# Patient Record
Sex: Female | Born: 1970 | Race: Black or African American | Hispanic: No | Marital: Married | Smoking: Former smoker
Health system: Southern US, Community
[De-identification: ages and names within clinical notes are randomized; demographics above are authoritative.]

## PROBLEM LIST (undated history)

## (undated) DIAGNOSIS — I209 Angina pectoris, unspecified: Secondary | ICD-10-CM

## (undated) DIAGNOSIS — D649 Anemia, unspecified: Secondary | ICD-10-CM

## (undated) DIAGNOSIS — F32A Depression, unspecified: Secondary | ICD-10-CM

## (undated) DIAGNOSIS — E039 Hypothyroidism, unspecified: Secondary | ICD-10-CM

## (undated) DIAGNOSIS — Z794 Long term (current) use of insulin: Secondary | ICD-10-CM

## (undated) DIAGNOSIS — F329 Major depressive disorder, single episode, unspecified: Secondary | ICD-10-CM

## (undated) DIAGNOSIS — E119 Type 2 diabetes mellitus without complications: Secondary | ICD-10-CM

## (undated) DIAGNOSIS — R51 Headache: Secondary | ICD-10-CM

## (undated) DIAGNOSIS — E785 Hyperlipidemia, unspecified: Secondary | ICD-10-CM

## (undated) HISTORY — DX: Type 2 diabetes mellitus without complications: E11.9

## (undated) HISTORY — DX: Long term (current) use of insulin: Z79.4

## (undated) HISTORY — DX: Hyperlipidemia, unspecified: E78.5

## (undated) HISTORY — DX: Major depressive disorder, single episode, unspecified: F32.9

## (undated) HISTORY — DX: Depression, unspecified: F32.A

## (undated) HISTORY — DX: Morbid (severe) obesity due to excess calories: E66.01

---

## 1998-01-15 ENCOUNTER — Other Ambulatory Visit: Admission: RE | Admit: 1998-01-15 | Discharge: 1998-01-15 | Payer: Self-pay | Admitting: Obstetrics and Gynecology

## 1999-05-09 ENCOUNTER — Other Ambulatory Visit: Admission: RE | Admit: 1999-05-09 | Discharge: 1999-05-09 | Payer: Self-pay | Admitting: Obstetrics and Gynecology

## 1999-10-10 ENCOUNTER — Other Ambulatory Visit: Admission: RE | Admit: 1999-10-10 | Discharge: 1999-10-10 | Payer: Self-pay | Admitting: *Deleted

## 2000-09-29 ENCOUNTER — Encounter: Admission: RE | Admit: 2000-09-29 | Discharge: 2000-12-28 | Payer: Self-pay | Admitting: Family Medicine

## 2000-10-07 ENCOUNTER — Other Ambulatory Visit: Admission: RE | Admit: 2000-10-07 | Discharge: 2000-10-07 | Payer: Self-pay | Admitting: Obstetrics and Gynecology

## 2002-06-14 ENCOUNTER — Other Ambulatory Visit: Admission: RE | Admit: 2002-06-14 | Discharge: 2002-06-14 | Payer: Self-pay | Admitting: Obstetrics and Gynecology

## 2002-06-29 ENCOUNTER — Emergency Department (HOSPITAL_COMMUNITY): Admission: EM | Admit: 2002-06-29 | Discharge: 2002-06-29 | Payer: Self-pay | Admitting: Emergency Medicine

## 2003-06-22 ENCOUNTER — Other Ambulatory Visit: Admission: RE | Admit: 2003-06-22 | Discharge: 2003-06-22 | Payer: Self-pay | Admitting: Obstetrics and Gynecology

## 2003-09-08 ENCOUNTER — Encounter: Admission: RE | Admit: 2003-09-08 | Discharge: 2003-09-08 | Payer: Self-pay | Admitting: Obstetrics and Gynecology

## 2004-10-24 ENCOUNTER — Other Ambulatory Visit: Admission: RE | Admit: 2004-10-24 | Discharge: 2004-10-24 | Payer: Self-pay | Admitting: Obstetrics and Gynecology

## 2005-03-28 ENCOUNTER — Emergency Department (HOSPITAL_COMMUNITY): Admission: EM | Admit: 2005-03-28 | Discharge: 2005-03-28 | Payer: Self-pay | Admitting: Family Medicine

## 2005-03-28 ENCOUNTER — Ambulatory Visit (HOSPITAL_COMMUNITY): Admission: RE | Admit: 2005-03-28 | Discharge: 2005-03-28 | Payer: Self-pay | Admitting: Family Medicine

## 2005-10-24 ENCOUNTER — Other Ambulatory Visit: Admission: RE | Admit: 2005-10-24 | Discharge: 2005-10-24 | Payer: Self-pay | Admitting: Obstetrics and Gynecology

## 2006-05-07 ENCOUNTER — Emergency Department (HOSPITAL_COMMUNITY): Admission: EM | Admit: 2006-05-07 | Discharge: 2006-05-08 | Payer: Self-pay | Admitting: Emergency Medicine

## 2006-09-29 ENCOUNTER — Encounter: Admission: RE | Admit: 2006-09-29 | Discharge: 2006-09-29 | Payer: Self-pay | Admitting: Gastroenterology

## 2006-11-12 ENCOUNTER — Ambulatory Visit (HOSPITAL_COMMUNITY): Admission: RE | Admit: 2006-11-12 | Discharge: 2006-11-12 | Payer: Self-pay | Admitting: Obstetrics and Gynecology

## 2007-03-11 ENCOUNTER — Inpatient Hospital Stay (HOSPITAL_COMMUNITY): Admission: AD | Admit: 2007-03-11 | Discharge: 2007-03-12 | Payer: Self-pay | Admitting: Obstetrics and Gynecology

## 2007-03-23 ENCOUNTER — Inpatient Hospital Stay (HOSPITAL_COMMUNITY): Admission: AD | Admit: 2007-03-23 | Discharge: 2007-03-23 | Payer: Self-pay | Admitting: Obstetrics and Gynecology

## 2007-04-15 ENCOUNTER — Inpatient Hospital Stay (HOSPITAL_COMMUNITY): Admission: AD | Admit: 2007-04-15 | Discharge: 2007-04-19 | Payer: Self-pay | Admitting: Pediatrics

## 2008-04-20 ENCOUNTER — Encounter: Admission: RE | Admit: 2008-04-20 | Discharge: 2008-04-20 | Payer: Self-pay | Admitting: Family Medicine

## 2008-04-20 ENCOUNTER — Encounter: Payer: Self-pay | Admitting: Internal Medicine

## 2008-07-31 DIAGNOSIS — E785 Hyperlipidemia, unspecified: Secondary | ICD-10-CM | POA: Insufficient documentation

## 2008-08-01 ENCOUNTER — Ambulatory Visit: Payer: Self-pay | Admitting: Internal Medicine

## 2008-08-01 DIAGNOSIS — R0602 Shortness of breath: Secondary | ICD-10-CM | POA: Insufficient documentation

## 2008-08-01 DIAGNOSIS — R059 Cough, unspecified: Secondary | ICD-10-CM | POA: Insufficient documentation

## 2008-08-01 DIAGNOSIS — R05 Cough: Secondary | ICD-10-CM

## 2008-08-16 ENCOUNTER — Ambulatory Visit (HOSPITAL_COMMUNITY): Admission: RE | Admit: 2008-08-16 | Discharge: 2008-08-16 | Payer: Self-pay | Admitting: Internal Medicine

## 2009-10-05 ENCOUNTER — Ambulatory Visit (HOSPITAL_COMMUNITY): Admission: RE | Admit: 2009-10-05 | Discharge: 2009-10-05 | Payer: Self-pay | Admitting: General Surgery

## 2009-10-12 ENCOUNTER — Ambulatory Visit (HOSPITAL_COMMUNITY): Admission: RE | Admit: 2009-10-12 | Discharge: 2009-10-12 | Payer: Self-pay | Admitting: General Surgery

## 2009-11-10 ENCOUNTER — Encounter: Admission: RE | Admit: 2009-11-10 | Discharge: 2009-12-21 | Payer: Self-pay | Admitting: General Surgery

## 2010-08-06 NOTE — Discharge Summary (Signed)
NAMEJarold Song NO.:  [id number]   MEDICAL RECORD NO.:  [id number]          PATIENT TYPE:  INP   LOCATION:  9128                          FACILITY:  [facility]   PHYSICIAN:  Hal Morales, M.D.DATE OF BIRTH:  05-12-1970   DATE OF ADMISSION:  04/15/2007  DATE OF DISCHARGE:  04/19/2007                               DISCHARGE SUMMARY   ADMISSION DIAGNOSES:  1. Intrauterine pregnancy at 38-5/7 weeks.  2. Premature rupture of membranes with meconium stained fluid.  3. Type 2 diabetes mellitus.   DISCHARGE DIAGNOSES:  1. Intrauterine pregnancy at term.  2. Failure to progress.  3. Status post primary low transverse cesarean section.  4. Pumping and breast-feeding.  5. Postpartum anemia without hemodynamic instability.  6. History of type 2 diabetes.   PROCEDURES:  1. Primary low transverse cesarean section.  2. Epidural anesthesia.   HOSPITAL COURSE:  Ms. Elana Alm is a 40 year old single black female,  primigravida at 38-5/7 weeks who presented with leakage of yellow fluid  at around 10 p.m. on April 14, 2007.  She was admitted at  approximately midnight on April 15, 2007.  Her pregnancy was followed  by [facility] OB/GYN MD  Service and history had been remarkable  for  1. Type 2 diabetes.  2. Questionable last menstrual period.  3. Abnormal quad screen with normal amniocentesis.  4. Group Beta Strep negative.   Patient had been on an insulin schedule of Humulin 14 units every  morning and 12 units every p.m.  On admission to birthing suites,  patient's CBG was 97.  Fetal heart rate was in the 140s with some 10 x  10 accels, however, no contractions.  Cervix was closed 70%, vertex and  -3, moderate yellow meconium-stained fluid was noted on perineum and she  was admitting to birthing suits with plan for pitocin per low dose for  induction of labor and CBGs every two hours.  At approximately 4:30 a.m.  on April 15, 2007, irregular  contractions were noted.  Blood sugars  were within normal limits.  Patient had a reactive NST and pitocin was  infusing per protocol.  At approximately 9:00 a.m. on April 15, 2007,  the patient had a negative OCT.  Blood pressures were stable, 130s-  140s/70s to mid 80s.  Blood sugars were normal and pitocin continued to  infuse.  Patient did receive pastoral care per request and patient was  taking a Bible secondary to pastoral care.  At 4:52 p.m., Dr. Stefano Gaul  noted patient's cervix to be 1 cm, 75% effaced, -3 and vertex.  IUPC was  placed but not functioning.  Pitocin was infusing and patient was  stable.  OCT was negative.  Approximately 10:50 p.m. on April 15, 2007, cervix was 2 cm, 80% effaced, -2 to -3, more anterior, decels had  been noted but negative OCT at time of assessment.  Patient was  comfortable status post an epidural.  IUPC was replaced and pitocin  continued to infuse.  Just before midnight on April 15, 2007, return  of decels, amnio infusion was begun, decels  resolved and resulted with a  negative OCT and pitocin was to continue.  At 1:30 a.m. on April 16, 2007, cervix was unchanged by nurse and observation was continued.  At  5:15 a.m. on April 16, 2007, cervix was 3 cm, 75% effaced, -1, vertex  and swelling of cervix was noted per Dr. Stefano Gaul.  Blood sugar was 128  at that time. Vital signs were stable otherwise.  OCT was negative and  plan was made to continue to observe her labor progression.  At 6:34  a.m., the cervix remained 3 cm, 75% effaced, -1 to 0 station and vertex.  Patient complained of pressure.  [place] units were 200.  Blood sugar  133.  Options were reviewed with the patient per Dr. Stefano Gaul.  Patient  desired to continue to try for a vaginal delivery.  At 9 a.m., pitocin  was at 38 milliunits per minute.  Patient was contracting every two  minutes.  MVUs were 170-190, which had been adequate since 5:00 a.m.  Fetal heart rate was  reassuring.  Vaginal exam was 3, 80, vertex and 0  with moderate amount of molding per Dr. Estanislado Pandy and an anterior edematous  lip.  There was no change in four hours.  Options were reviewed with the  patient, i.e., increasing pitocin to 40 milliunits per minute and  reevaluate in two hours or proceed with cesarean section.  Patient  desired to proceed with cesarean section secondary to exhaustion and  failure to progress and risks and benefits were reviewed.  Questions  were answered.  Patient was prepped for OR and findings following  primary low transverse cesarean section per Dr. Estanislado Pandy under epidural  anesthesia were a viable female by the name of Selah.  Apgars were 5 at  one minute and 8 at five minutes.  Weight was 6 pounds 4 ounces and  newborn was found to be in OP presentation.  Patient tolerated the  procedure well and she was sent to PACU in good condition.   By postoperative day #1, patient was doing well.  She was ambulating,  voiding and eating without difficulty and breast-feeding was going slow.  Hemoglobin was down to 9 from 11.3 on the day of admission.  Vital signs  were stable and afebrile.  Physical examination was within normal  limits.  Incision was clean, dry and intact.  Fundus was firm.  Abdomen  was soft and appropriately tender.  She had normal lochia and  extremities were within normal limits.  Fasting blood sugar was 105.  Two hours after breakfast, this was 143, however, had some orange juice.  Two hours after dinner on April 16, 2007, it was 129; two hours after  lunch that day, it was 136.  Continued to have routine postpartum care  and CBGs were closely monitored.  Patient was to discontinue dressing  after shower.  During postpartum stay, newborn female was admitted to  NICU.  Social work consult was done on April 18, 2007, which was  postoperative day #2.  On that day, patient was doing okay, some stomach  upset after taking Motrin but no emesis.   She was ambulating, voiding  and tolerating p.o. liquids and solids without difficulty with positive  flatulence and negative bowel movement.  Infant in NICU per patient  secondary to an infection but was doing okay.  She had been pumping but  with little milk production.  Patient's vital signs remained stable.  She was afebrile.  Blood pressures  were 97-119/62-81.  Incision had  Steri-Strips intact.  She had bowel sounds x4 quadrants.  It was soft  and appropriately tender.  Fundus was firm, some bloody drainage was  noted at incision.  Extremities with trace edema but negative Homan's.  CBGs fasting on April 18, 2007, was 111; two hours postprandial on  April 17, 2007, at 1600 had been 133 at 2309.  Patient remained  asymptomatic with postpartum anemia and patient was encouraged to pump  every two hours to increase milk supply.   On postoperative day #3, patient was met in hallway on her way up to  NICU and was doing well.  She did desire to be discharged home, however,  newborn female was still in NICU and, per patient, NICU staff proposing  approximately one week stay for antibiotics.  Patient was tolerating a  regular diet.  She was up ad lib.  She was voiding without difficulty.  Pain was well controlled with Motrin and Tylox.  She was undecided  regarding postpartum birth control method.  She declined contraception  at present and planned to reassess contraceptive options at six-week  postpartum visit.  Patient was pumping and she did report a small amount  of milk noted.  She did report newborn female had latched on on her a.m.  trip to the NICU.  Her vital signs remained stable.  Her fasting CBG  this morning was 118.  Yesterday, at 7:15 in the morning, fasting was  111; at 1530 on April 18, 2007, was 171, at 8:00 p.m. on April 18, 2007, it was 118.  Her physical examination was within normal limits.  Steri-Strips remained intact with old drainage.  Extremities with  trace  edema, negative Homan's and small lochia which was rubra.  Patient was  deemed to have received full benefit of hospital stay and was discharged  home in stable condition.   DISCHARGE INSTRUCTIONS:  Per [facility] pamphlet.  Warning signs and symptoms  to report were reviewed per mother/baby RN.   DISCHARGE MEDICATIONS:  1. Motrin 600 mg one tablet p.o. q.6h. p.r.n. pain.  2. Tylox one to two tablets p.o. q.4-6h. p.r.n. moderate to severe      pain.  3. Slow Fe over-the-counter one tablet p.o. daily.  4. Protonix 40 mg one tablet p.o. daily.  5. Continue prenatal vitamins one tablet p.o. daily as well.  6. Stool softener such as docusate sodium one tablet p.o. daily or      b.i.d. p.r.n. for constipation.   FOLLOW UP:  This is to occur in six weeks or p.r.n. when patient will  rediscuss contraceptive options.  Patient will also have a fasting blood  sugar, cholesterol, lipids and hemoglobin A1c at her six-week checkup as  well.      Candice [place], [place]      Hal Morales, M.D.  Electronically Signed    CHS/MEDQ  D:  04/19/2007  T:  04/19/2007  Job:  [id number]

## 2010-08-06 NOTE — Op Note (Signed)
NAME:  Virginia Stewart NO.:  [id number]   MEDICAL RECORD NO.:  [id number]          PATIENT TYPE:  INP   LOCATION:  9128                          FACILITY:  [facility]   PHYSICIAN:  Crist Fat. Rivard, M.D. DATE OF BIRTH:  1971-02-15   DATE OF PROCEDURE:  04/16/2007  DATE OF DISCHARGE:                               OPERATIVE REPORT   PREOPERATIVE DIAGNOSES:  1. Intrauterine pregnancy at 38 weeks 6 days.  2. Type 2 diabetes.  3. Failure to progress.   POSTOPERATIVE DIAGNOSES:  1. Intrauterine pregnancy at 38 weeks 6 days.  2. Type 2 diabetes.  3. Failure to progress.   ANESTHESIA:  Epidural, Quillian Quince, M.D.   PROCEDURE:  Primary low transverse cesarean section.   SURGEON:  Crist Fat. Rivard, M.D.   ASSISTANT:  Larna Daughters, CNM.   ESTIMATED BLOOD LOSS:  800 mL.   PROCEDURE:  After being informed of the planned procedure with possible  complications including bleeding, infection, injury to bowels, bladder  or ureter, informed consent is obtained.  The patient is taken to OR #1  and preexisting epidural is intensified.  The patient is placed in the  dorsal decubitus position, pelvis tilted to the left.  She is prepped  and draped in a sterile fashion and a Foley catheter is already in her  bladder.  After assessing adequate level of anesthesia, we infiltrate  the suprapubic area with 20 mL of Marcaine 0.25% and we perform a  Pfannenstiel incision, which is brought down sharply to the fascia.  The  fascia is incised in a low transverse fashion.  Linea alba is dissected.  Peritoneum is entered in a midline fashion.  An Alexis retractor is  placed easily and a bladder blade is inserted.  Visceral peritoneum is  entered in a low transverse fashion, allowing [place] to safely retract  bladder by developing a bladder flap.  Myometrium is then entered in a  low transverse fashion, first with knife, then extended bluntly.  Amniotic fluid is abundant and  yellow-tinged.  We assist the birth of a  female infant in direct OP presentation.  Mouth and nose were suctioned.  Baby is delivered.  Cord is clamped with two Kelly clamps and sectioned  and the baby is given to Dr. Katrinka Blazing, neonatologist present in the room.  The placenta is allowed to deliver spontaneously.  It is complete, the  cord has three vessels, and it is sent for cord blood donation, then to  labor and delivery.  Ancef 2 g IV is given to the patient and a  perfusion of Pitocin is started.  Uterine revision is negative.   We proceed with closure of the myometrium in two layers, first with a  running locked suture of 0 Vicryl, then with a Lembert suture of 0  Vicryl imbricating the first one.  A tear in the lower uterine segment  in the right angle was repaired with two figure-of-eight stitches of 0  Vicryl and was incorporated in the second layer of the Lembert suture.  Hemostasis is checked and adequate.  Both paracolic  gutters are cleaned.  Both tubes and ovaries are assessed and normal except for some fine  adhesions between the right tube and the ovary, which are removed  sharply and with Bovie, as well as a small right paratubal cyst which is  evacuated with Bovie.  We then irrigate profusely the pelvis with warm  saline and note a satisfactory hemostasis.  Retractors are removed.  Under-fascia hemostasis was completed with cautery and the fascia is  closed with two running sutures of #1 Vicryl meeting midline.  The wound  is irrigated with warm saline and the incision is closed with a  subcuticular suture of 3-0 Monocryl and Steri-Strips.   Instrument and sponge count is complete x2.  Estimated blood loss is 800  mL.  The procedure is very well-tolerated by the patient, who is taken  to recovery room in a well and stable condition.   A little girl named Selah received an Apgar of 5 at one minute and 8 at  five minutes and weighs 6 pounds 4 ounces.   SPECIMEN:  Placenta  sent to cord blood donation, then to labor and  delivery.      Crist Fat Rivard, M.D.  Electronically Signed     SAR/MEDQ  D:  04/16/2007  T:  04/16/2007  Job:  [id number]
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
## 2010-08-06 NOTE — Op Note (Signed)
NAME:  Virginia Stewart NO.:  [id number]   MEDICAL RECORD NO.:  [id number]          PATIENT TYPE:  OUT   LOCATION:  ULT                           [facility]   PHYSICIAN:  Hal Morales, M.D.DATE OF BIRTH:  11-14-1970   DATE OF PROCEDURE:  11/12/2006  DATE OF DISCHARGE:                               OPERATIVE REPORT   PREOPERATIVE DIAGNOSIS:  Intrauterine pregnancy at 67 weeks' gestation,  increased risk of Down syndrome on maternal serum screening.   POSTOPERATIVE DIAGNOSES:  Intrauterine pregnancy at 19 weeks' gestation,  increased risk of Down syndrome on maternal serum screening.   OPERATION:  Genetic amniocentesis.   ANESTHESIA:  Local.   ESTIMATED BLOOD LOSS:  Was less than 5 mL.   COMPLICATIONS:  None.   FINDINGS:  The placenta was located anteriorly and right lateral.  The  amniotic fluid volume was essentially within normal limits.   PROCEDURE:  The patient was placed in the supine position in the  ultrasound suite.  A preliminary ultrasound evaluation of the amniotic  fluid pocket was performed.  An area that corresponded to the midline  approximately 5 cm below the umbilicus was marked.  That area was  cleansed with multiple layers of Betadine.  The area was infiltrated  with 1% Xylocaine.  A 22 gauge ultraview needle was used to access the  amniotic fluid sac on a single pass.  5 mL of clear amniotic fluid were  drawn into the first syringe and then 10 mL of amniotic fluid drawn into  the second syringe.  Documentation of needle placement was undertaken  and the needle removed.  The post amniocentesis heart rate was 141 beats  per minute.  The patient's blood type is O+.  The patient tolerated the  procedure well and was given written instructions for post amniocentesis  care.      Hal Morales, M.D.  Electronically Signed     VPH/MEDQ  D:  11/12/2006  T:  11/12/2006  Job:  [id number]
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
## 2010-08-06 NOTE — Discharge Summary (Signed)
NAMEJarold Song NO.:  [id number]   MEDICAL RECORD NO.:  [id number]          PATIENT TYPE:  INP   LOCATION:  9149                          FACILITY:  [facility]   PHYSICIAN:  Osborn Coho, M.D.   DATE OF BIRTH:  1970/05/25   DATE OF ADMISSION:  03/11/2007  DATE OF DISCHARGE:  03/12/2007                               DISCHARGE SUMMARY   DISCHARGING PHYSICIAN:  Dr. Su Hilt.   ADMISSION DIAGNOSES:  1. Intrauterine pregnancy at 33-6/7 weeks.  2. Type 2 diabetes requiring insulin.  3. Decreased AFI with BPP in the office.  4. Abnormal quad screen during the pregnancy was normal amnio.   DISCHARGE DIAGNOSES:  1. Same with the addition of reassuring BPP and normal AFI on      discharge.   HOSPITAL PROCEDURES:  1. IV fluids.  2. Ultrasound.   HOSPITAL COURSE:  The patient was admitted after having decreased AFI on  office ultrasound.  She was placed on IV fluids for hydration and had a  repeat ultrasound today which showed a BPP score of 8/8 with an AFI of  12.69, which represents 39 percentile.  Placenta was anterior, and the  fetus was in a vertex position.  She was deemed to receive full benefit  of her hospital stay and was discharged home by Dr. Su Hilt.  Throughout  hospitalization, her fetal heart tones were reassuring with no  contractions.  A fasting blood sugar this morning was 81.  Other blood  sugars were in the low 100s.   DISCHARGE MEDICATIONS:  1. Prenatal vitamins one p.o. daily.  2. Insulin NPH 15 units at bedtime and NPH of 12 units in a.m. with      sliding scale.   DISCHARGE LABS:  None.   DISCHARGE INSTRUCTIONS:  Include normal care and monitoring of fetal  kick counts.   DISCHARGE FOLLOWUP:  Will occur on March 17, 2007, for a visit in the  office with an ultrasound.   CONDITION ON DISCHARGE:  Good.      Marie L. Williams, C.N.M.      Osborn Coho, M.D.  Electronically Signed    MLW/MEDQ  D:  03/12/2007  T:   03/14/2007  Job:  [id number]

## 2010-10-23 ENCOUNTER — Emergency Department (HOSPITAL_COMMUNITY)
Admission: EM | Admit: 2010-10-23 | Discharge: 2010-10-24 | Disposition: A | Discharge status: Home / Self Care | Payer: BC Managed Care – PPO | Attending: Emergency Medicine | Admitting: Emergency Medicine

## 2010-10-23 DIAGNOSIS — Z794 Long term (current) use of insulin: Secondary | ICD-10-CM | POA: Insufficient documentation

## 2010-10-23 DIAGNOSIS — D649 Anemia, unspecified: Secondary | ICD-10-CM | POA: Insufficient documentation

## 2010-10-23 DIAGNOSIS — R079 Chest pain, unspecified: Secondary | ICD-10-CM | POA: Insufficient documentation

## 2010-10-23 DIAGNOSIS — E871 Hypo-osmolality and hyponatremia: Secondary | ICD-10-CM | POA: Insufficient documentation

## 2010-10-23 DIAGNOSIS — I209 Angina pectoris, unspecified: Secondary | ICD-10-CM

## 2010-10-23 DIAGNOSIS — E876 Hypokalemia: Secondary | ICD-10-CM | POA: Insufficient documentation

## 2010-10-23 DIAGNOSIS — E119 Type 2 diabetes mellitus without complications: Secondary | ICD-10-CM | POA: Insufficient documentation

## 2010-10-23 HISTORY — DX: Angina pectoris, unspecified: I20.9

## 2010-10-23 LAB — COMPREHENSIVE METABOLIC PANEL
Alkaline Phosphatase: 99 U/L (ref 39–117)
BUN: 11 mg/dL (ref 6–23)
Calcium: 9.8 mg/dL (ref 8.4–10.5)
GFR calc Af Amer: >60 mL/min (ref >60)
GFR calc non Af Amer: >60 mL/min (ref >60)
Potassium: 3.9 mEq/L (ref 3.5–5.1)
Sodium: 132 mEq/L — ABNORMAL LOW (ref 135–145)

## 2010-10-23 LAB — CBC
HCT: 34.9 % — ABNORMAL LOW (ref 36.0–46.0)
Hemoglobin: 11 g/dL — ABNORMAL LOW (ref 12.0–15.0)

## 2010-10-23 LAB — DIFFERENTIAL
Basophils Absolute: 0 10*3/uL (ref 0.0–0.1)
Basophils Relative: 0 % (ref 0–1)
Eosinophils Relative: 1 % (ref 0–5)
Lymphs Abs: 3.2 10*3/uL (ref 0.7–4.0)
Monocytes Absolute: 0.5 10*3/uL (ref 0.1–1.0)

## 2010-10-23 LAB — CK TOTAL AND CKMB (NOT AT ARMC): Relative Index: 0.7 (ref 0.0–2.5)

## 2010-10-23 LAB — TROPONIN I: Troponin I: <0.3 ng/mL (ref <0.30)

## 2010-10-24 ENCOUNTER — Emergency Department (HOSPITAL_COMMUNITY): Payer: BC Managed Care – PPO

## 2010-10-24 LAB — TROPONIN I: Troponin I: <0.3 ng/mL (ref <0.30)

## 2010-10-24 LAB — CK TOTAL AND CKMB (NOT AT ARMC): Relative Index: 0.7 (ref 0.0–2.5)

## 2010-11-20 ENCOUNTER — Other Ambulatory Visit: Payer: Self-pay | Admitting: Family Medicine

## 2010-11-22 ENCOUNTER — Ambulatory Visit
Admission: RE | Admit: 2010-11-22 | Discharge: 2010-11-22 | Disposition: A | Discharge status: Home / Self Care | Payer: BC Managed Care – PPO | Source: Ambulatory Visit | Attending: Family Medicine | Admitting: Family Medicine

## 2010-11-22 MED ORDER — GADOBENATE DIMEGLUMINE 529 MG/ML IV SOLN
20.0000 mL | Freq: Once | INTRAVENOUS | Status: AC | PRN
  Administered 2010-11-22: 20 mL via INTRAVENOUS

## 2010-12-12 LAB — CBC
Hemoglobin: 9 — ABNORMAL LOW
MCV: 84.8
Platelets: 226
RBC: 3.06 — ABNORMAL LOW
RDW: 14
RDW: 14.4
WBC: 9.6

## 2010-12-12 LAB — RPR: RPR Ser Ql: NONREACTIVE

## 2011-01-03 LAB — ACETYLCHOLINESTERASE

## 2011-01-03 LAB — CHROMOSOME ANALYSIS, AMNIOTIC FLUID (PERF AT WFU)

## 2011-03-13 ENCOUNTER — Ambulatory Visit (INDEPENDENT_AMBULATORY_CARE_PROVIDER_SITE_OTHER): Payer: Self-pay | Admitting: General Surgery

## 2011-06-11 ENCOUNTER — Encounter (INDEPENDENT_AMBULATORY_CARE_PROVIDER_SITE_OTHER): Payer: Self-pay | Admitting: General Surgery

## 2011-06-12 ENCOUNTER — Encounter (INDEPENDENT_AMBULATORY_CARE_PROVIDER_SITE_OTHER): Payer: Self-pay | Admitting: General Surgery

## 2011-06-12 ENCOUNTER — Ambulatory Visit (INDEPENDENT_AMBULATORY_CARE_PROVIDER_SITE_OTHER): Payer: BC Managed Care – PPO | Admitting: General Surgery

## 2011-06-12 DIAGNOSIS — Z794 Long term (current) use of insulin: Secondary | ICD-10-CM

## 2011-06-12 DIAGNOSIS — E119 Type 2 diabetes mellitus without complications: Secondary | ICD-10-CM

## 2011-06-12 NOTE — Progress Notes (Addendum)
Patient ID: Virginia Stewart, female   DOB: [birth year], 41 y.o.   MRN: [id number]  Chief Complaint  Patient presents with  . Weight Loss Surgery    HPI Virginia Stewart is a 41 y.o. female.   HPI 41 year old morbidly obese African American female comes in to rediscuss weight loss surgery. I initially saw her in the summer of 2011. She was actually approved for laparoscopic adjustable gastric band placement surgery that fall. However she had an outstanding debt and lost her job and therefore she postponed surgery.  Since that time, she states that her diabetes has worsened. She is now on insulin. She is taking anywhere from 50-80 units at night. She denies any new surgeries since her last visit.  Despite numerous attempts for sustained weight loss she has been unsuccessful. She has tried the [employer], [employer], [employer], Slim fast, phentermine, all without any long-term success. One of her main goals for weight loss surgery is improvement in her diabetes. She is still interested in lap band surgery over gastric bypass surgery. 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 Past Medical History  Diagnosis Date  . Insulin dependent type 2 diabetes mellitus   . Asthma   . Hyperlipidemia   . Depression     Past Surgical History  Procedure Date  . Cesarean section 04/16/2007    Family History  Problem Relation Age of Onset  . Diabetes Father   . Lung cancer Father   . Diabetes Mother   . Hypertension Mother     Social History History  Substance Use Topics  . Smoking status: Former Smoker    Quit date: 06/11/2004  . Smokeless tobacco: Not on file  . Alcohol Use: 1.0 - 1.5 oz/week    2-3 drink(s) per week    No Known Allergies  Current Outpatient Prescriptions  Medication Sig Dispense Refill  . citalopram (CELEXA) 20 MG tablet Take 20 mg by mouth daily.      . insulin glargine (LANTUS) 100 UNIT/ML injection Inject 60 Units into the skin  at bedtime.      . sitaGLIPtin (JANUVIA) 100 MG tablet Take 100 mg by mouth daily.        Review of Systems Review of Systems  Constitutional: Negative for fever, activity change, appetite change and unexpected weight change.  HENT: Positive for sneezing. Negative for hearing loss, nosebleeds, neck pain and neck stiffness.   Eyes: Negative for photophobia, redness and visual disturbance.  Respiratory: Negative for apnea, chest tightness and shortness of breath.        Uses inhaler prn  Cardiovascular: Negative for chest pain, palpitations and leg swelling.       Denies CP, SOB, DOE, orthopnea, pnd  Gastrointestinal: Negative for nausea, vomiting, abdominal pain, diarrhea, constipation and abdominal distention.       Denies reflux, indigestion  Genitourinary: Negative for dysuria, hematuria, menstrual problem and pelvic pain.       Normal monthly menses   Musculoskeletal: Negative for arthralgias and gait problem.       B/l knee pain; rt ankle pain  Skin: Negative for pallor and rash.  Neurological: Negative for tremors, seizures, speech difficulty, light-headedness and headaches.  Hematological: Negative for adenopathy. Does not bruise/bleed easily.  Psychiatric/Behavioral: Negative for hallucinations, behavioral problems, sleep disturbance and self-injury.       Takes med for depression; good mood recently.     Blood pressure 136/78, pulse 88, resp. rate 20,  height 5\' 7"  (1.702 m), weight 287 lb 8 oz (130.409 kg).  Physical Exam Physical Exam  Vitals reviewed. Constitutional: She is oriented to person, place, and time. She appears well-developed and well-nourished.       Morbidly obese  HENT:  Head: Normocephalic and atraumatic.  Right Ear: External ear normal.  Left Ear: External ear normal.  Eyes: Conjunctivae are normal. No scleral icterus.  Neck: Normal range of motion. Neck supple. No JVD present. No tracheal deviation present. No thyromegaly present.  Cardiovascular:  Normal rate, regular rhythm, normal heart sounds and intact distal pulses.   Pulmonary/Chest: No respiratory distress. She has no wheezes. She exhibits no tenderness.  Abdominal: Soft. Bowel sounds are normal. She exhibits no distension. There is no tenderness. There is no rebound.    Musculoskeletal: Normal range of motion. She exhibits no edema and no tenderness.  Lymphadenopathy:    She has no cervical adenopathy.  Neurological: She is alert and oriented to person, place, and time. She exhibits normal muscle tone.  Skin: Skin is warm and dry. No rash noted. No erythema.  Psychiatric: She has a normal mood and affect. Her behavior is normal. Judgment and thought content normal.    Data Reviewed My note from 2011 Abd u/s 09/2009 - gallstones Upper gi 09/2009 - normal  Psych note Letter of medical necessity Labs from 10/2009: A1C-7.7; normal cmet/cbc except for bld glucose of 250; lipid panel normal except for LDL 108  Assessment    Morbid obesity Dyslipidemia Asthma Insulin dependent diabetes mellitus Joint pain     Plan    I believe the patient is a good candidate for weight loss surgery. She definitely meets criteria for weight loss surgery.  We discussed laparoscopic adjustable gastric banding. The patient was given Agricultural engineer. We discussed the risk and benefits of surgery including but not limited to bleeding, infection, injury to surrounding structures, blood clot formation such as deep venous thrombosis or pulmonary embolism, need to convert to an open procedure, band slippage, band erosion, failure to loose weight, port complications (leak or flippage), potential need for reoperative surgery, esophageal dilatation, worsening reflux, and vitamin deficiencies. We discussed the typical post operative recovery course. We discussed that their postoperative diet will be modified for several weeks. We specifically talked about the need to be on a liquid diet for one to 2  weeks after surgery. We also discussed the typical postoperative course with a laparoscopic adjustable gastric band and the need for frequent postoperative visits to assess the volume status of the band.  We discussed the typical expected weight loss with a laparoscopic adjustable gastric band. I explained to the patient that they can expect to lose 40-60% of their excess body weight if they are compliant with their postoperative instructions. However I did explain that some patients loose less than 40% and some patients lose more than 60% of their excess body weight.  I explained that the likelihood of improvement in their obesity is good.  I did discuss laparoscopic Roux-en-Y gastric bypass. We did discuss that typically gastric bypass patients have a better response with resolution of their diabetes as compared with adjustment gastric band surgery. We briefly discussed the risk and benefits of laparoscopic Roux-en-Y gastric bypass.  She asked a question regarding future pregnancies with respect to two surgeries. I advised her that with gastric bypass surgery we recommend delaying pregnancy for at least one year. This recommendation also holds true for lap band surgery. With respect to the impact  of the surgery on her pregnancy, I advised her that there would be little difference between the 2 surgeries on her pregnancy. We discussed that her diabetes and obesity would be more of the impact on her pregnancy.  After discussing the pros and cons of laparoscopic Roux-en-Y gastric bypass versus laparoscopic adjustable gastric band placement with respect to her diabetes, the patient has elected to proceed with laparoscopic adjustable gastric band placement.  We will repeat her lab work and refer her to a nutritionist for evaluation. I do not see the need to repeat her upper GI or abdominal ultrasound since there are no new GI complaints or issues.  07/14/11 - pt called office to let us know that after  additional reflection and consideration, she has changed her mind and would to have a LAPAROSCOPIC ROUX EN Y GASTRIC BYPASS since her number 1 goal with weight loss surgery is resolution of her Diabetes and she believes that LRYGB will give her the best chance to accomplish this goal.  [name]. [name], MD, FACS General, Bariatric, & Minimally Invasive Surgery [facility], [place]        [facility] M 06/12/2011, 5:45 PM

## 2011-07-08 LAB — COMPREHENSIVE METABOLIC PANEL
ALT: 15 U/L (ref 0–35)
AST: 19 U/L (ref 0–37)
Albumin: 4.3 g/dL (ref 3.5–5.2)
Alkaline Phosphatase: 100 U/L (ref 39–117)
BUN: 7 mg/dL (ref 6–23)
Calcium: 9.1 mg/dL (ref 8.4–10.5)
Chloride: 100 mEq/L (ref 96–112)
Potassium: 4.1 mEq/L (ref 3.5–5.3)
Sodium: 138 mEq/L (ref 135–145)
Total Protein: 7.9 g/dL (ref 6.0–8.3)

## 2011-07-08 LAB — CBC WITH DIFFERENTIAL/PLATELET
Basophils Absolute: 0 10*3/uL (ref 0.0–0.1)
Basophils Relative: 1 % (ref 0–1)
HCT: 35.9 % — ABNORMAL LOW (ref 36.0–46.0)
Lymphocytes Relative: 37 % (ref 12–46)
MCHC: 30.6 g/dL (ref 30.0–36.0)
Monocytes Absolute: 0.5 10*3/uL (ref 0.1–1.0)
Neutro Abs: 2.7 10*3/uL (ref 1.7–7.7)
Neutrophils Relative %: 51 % (ref 43–77)
RDW: 14.7 % (ref 11.5–15.5)
WBC: 5.2 10*3/uL (ref 4.0–10.5)

## 2011-07-08 LAB — LIPID PANEL
LDL Cholesterol: 113 mg/dL — ABNORMAL HIGH (ref 0–99)
Total CHOL/HDL Ratio: 3.7 Ratio
VLDL: 12 mg/dL (ref 0–40)

## 2011-07-08 LAB — HEMOGLOBIN A1C: Hgb A1c MFr Bld: 8.8 % — ABNORMAL HIGH (ref <5.7)

## 2011-07-08 LAB — T4: T4, Total: 8.2 ug/dL (ref 5.0–12.5)

## 2011-07-08 LAB — TSH: TSH: 0.762 u[IU]/mL (ref 0.350–4.500)

## 2011-07-11 ENCOUNTER — Encounter: Payer: BC Managed Care – PPO | Attending: General Surgery | Admitting: *Deleted

## 2011-07-11 ENCOUNTER — Encounter: Payer: Self-pay | Admitting: *Deleted

## 2011-07-11 VITALS — Ht 67.0 in | Wt 284.7 lb

## 2011-07-11 DIAGNOSIS — Z01818 Encounter for other preprocedural examination: Secondary | ICD-10-CM | POA: Insufficient documentation

## 2011-07-11 DIAGNOSIS — Z713 Dietary counseling and surveillance: Secondary | ICD-10-CM | POA: Insufficient documentation

## 2011-07-11 NOTE — Progress Notes (Signed)
  Pre-Op Assessment Visit:  Pre-Operative LAGB Surgery  Medical Nutrition Therapy:  Appt start time: 1000   End time: 1045.  Patient was seen on 07/11/2011 for Pre-Operative LAGB Nutrition Assessment. Assessment and letter of approval faxed to [facility] Bariatric Surgery Program coordinator on 07/11/2011.  Approval letter sent to [facility] Scan center and will be available in the chart under the media tab.  Handouts given during visit include:  Pre-Op Goals   Bariatric Protein Shakes  Bariatric Support Group Calendar  B.E.L.T. Program Flyer  Patient to call for Pre-Op and Post-Op Nutrition Education at the [facility] when surgery is scheduled.

## 2011-07-11 NOTE — Patient Instructions (Signed)
   Follow Pre-Op Nutrition Goals to prepare for LAGB Surgery.   Call the [facility] at [phone] once you have been given your surgery date to enrolled in the Pre-Op Nutrition Class. You will need to attend this nutrition class 3-4 weeks prior to your surgery. 

## 2011-07-23 ENCOUNTER — Other Ambulatory Visit (INDEPENDENT_AMBULATORY_CARE_PROVIDER_SITE_OTHER): Payer: Self-pay | Admitting: General Surgery

## 2011-08-27 ENCOUNTER — Encounter (HOSPITAL_COMMUNITY): Payer: Self-pay | Admitting: Pharmacy Technician

## 2011-09-03 ENCOUNTER — Encounter (HOSPITAL_COMMUNITY): Payer: Self-pay

## 2011-09-03 ENCOUNTER — Encounter (HOSPITAL_COMMUNITY)
Admission: RE | Admit: 2011-09-03 | Discharge: 2011-09-03 | Disposition: A | Discharge status: Home / Self Care | Payer: BC Managed Care – PPO | Source: Ambulatory Visit | Attending: General Surgery | Admitting: General Surgery

## 2011-09-03 ENCOUNTER — Encounter (INDEPENDENT_AMBULATORY_CARE_PROVIDER_SITE_OTHER): Payer: Self-pay | Admitting: General Surgery

## 2011-09-03 ENCOUNTER — Ambulatory Visit (INDEPENDENT_AMBULATORY_CARE_PROVIDER_SITE_OTHER): Payer: Self-pay | Admitting: General Surgery

## 2011-09-03 ENCOUNTER — Ambulatory Visit (INDEPENDENT_AMBULATORY_CARE_PROVIDER_SITE_OTHER): Payer: BC Managed Care – PPO | Admitting: General Surgery

## 2011-09-03 VITALS — BP 124/82 | HR 94 | Temp 97.8°F | Resp 14 | Ht 67.0 in | Wt 285.4 lb

## 2011-09-03 HISTORY — DX: Hypothyroidism, unspecified: E03.9

## 2011-09-03 HISTORY — DX: Anemia, unspecified: D64.9

## 2011-09-03 HISTORY — DX: Headache: R51

## 2011-09-03 HISTORY — DX: Angina pectoris, unspecified: I20.9

## 2011-09-03 LAB — COMPREHENSIVE METABOLIC PANEL
ALT: 16 U/L (ref 0–35)
Albumin: 3.8 g/dL (ref 3.5–5.2)
Alkaline Phosphatase: 102 U/L (ref 39–117)
BUN: 10 mg/dL (ref 6–23)
Chloride: 94 mEq/L — ABNORMAL LOW (ref 96–112)
Glucose, Bld: 271 mg/dL — ABNORMAL HIGH (ref 70–99)
Potassium: 4 mEq/L (ref 3.5–5.1)
Sodium: 132 mEq/L — ABNORMAL LOW (ref 135–145)
Total Bilirubin: 0.2 mg/dL — ABNORMAL LOW (ref 0.3–1.2)
Total Protein: 8.2 g/dL (ref 6.0–8.3)

## 2011-09-03 LAB — CBC
HCT: 34.1 % — ABNORMAL LOW (ref 36.0–46.0)
Hemoglobin: 10.8 g/dL — ABNORMAL LOW (ref 12.0–15.0)
WBC: 6.5 10*3/uL (ref 4.0–10.5)

## 2011-09-03 LAB — SURGICAL PCR SCREEN
MRSA, PCR: NEGATIVE
Staphylococcus aureus: POSITIVE — AB

## 2011-09-03 LAB — DIFFERENTIAL
Basophils Relative: 1 % (ref 0–1)
Eosinophils Absolute: 0.1 10*3/uL (ref 0.0–0.7)
Eosinophils Relative: 1 % (ref 0–5)
Lymphs Abs: 2.2 10*3/uL (ref 0.7–4.0)
Monocytes Relative: 6 % (ref 3–12)
Neutrophils Relative %: 59 % (ref 43–77)

## 2011-09-03 MED ORDER — PEG 3350-KCL-NABCB-NACL-NASULF 236 G PO SOLR: 4.0000 L | Freq: Once | ORAL | Status: AC

## 2011-09-03 NOTE — Patient Instructions (Signed)
22 Virginia Stewart  09/03/2011   Your procedure is scheduled on:  09/08/11  Monday  [id number]  Report to [facility] at    0815   AM.  Call this number if you have problems the morning of surgery: [phone]     Or PST   [id number]  Virginia Stewart             BRING INHALER WITH YOU TO HOSPITAL/   NO ANTIINFLAMMATORIES OR ASPIRIN 7 days pre op                        CALL OFFICE REGARDING BOWEL PREP TODAY  Remember:            TAKE HALF DOSE INSULIN Sunday NIGHT        EAT SNACK BEFORE BED Sunday NIGHT  Do not eat food or drink any fluids :After Midnight. Sunday NIGHT OR AS DIRECTED FOR BOWEL PREP      Take these medicines the morning of surgery with A SIP OF WATER: WELLBUTRIN, CELEXA        advair if needed   Do not wear jewelry, make-up or nail polish.  Do not wear lotions, powders, or perfumes. You may wear deodorant.  Do not shave 48 hours prior to surgery.  Do not bring valuables to the hospital.  Contacts, dentures or bridgework may not be worn into surgery.  Leave suitcase in the car. After surgery it may be brought to your room.  For patients admitted to the hospital, checkout time is 11:00 AM the day of discharge.   Patients discharged the day of surgery will not be allowed to drive home.  Name and phone number of your driver:mother                                                                      Special Instructions: CHG Shower Use Special Wash: 1/2 bottle night before surgery and 1/2 bottle morning of surgery. REGULAR SOAP FACE AND PRIVATES                             MEN-MAY SHAVE FACE MORNING OF SURGERY  Please read over the following fact sheets that you were given: MRSA Information

## 2011-09-03 NOTE — Patient Instructions (Addendum)
   STOP TAKING THE SUDAFED

## 2011-09-03 NOTE — Progress Notes (Signed)
Patient ID: Virginia Stewart, female   DOB: 02/12/1971, 41 y.o.   MRN: [id number]  Chief Complaint  Patient presents with  . Bariatric Pre-op    Sx on 09/08/11    HPI Virginia Stewart is a 40 y.o. female.   HPI 40-year-old morbidly obese African American female comes in for her preop appt for her upcoming gastric bypass surgery. I initially saw her in the summer of 2011. She was actually approved for laparoscopic adjustable gastric band placement surgery that fall. However she had an outstanding debt and lost her job and therefore she postponed surgery.  Since that time, she states that her diabetes has worsened. She is now on insulin. Her insulin requirement is now around 95 units at night. She denies any new surgeries since her last visit.  Despite numerous attempts for sustained weight loss she has been unsuccessful. She has tried the Adkins diet, Weight Watchers, [place] diet, Slim fast, phentermine, all without any long-term success. One of her main goals for weight loss surgery is improvement in her diabetes. She is still interested in lap band surgery over gastric bypass surgery. 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  She denies any changes since her last visit in April.   She has stopped taking the claritin and sudafed since she started her pre-op diet last week. She is walking at least 3 times a week.  PMHx, PSHx, SOCHx, FAMHx, ALL reviewed and unchanged  Past Medical History  Diagnosis Date  . Insulin dependent type 2 diabetes mellitus   . Hyperlipidemia   . Depression   . Diabetes mellitus 2003  . Anginal pain 8/12    chest x ray, ekg epic- states was told anxiety attack- none since  . Asthma     with seasonal allergies  . Headache   . Anemia     borderline  . Hypothyroidism     borderline    Past Surgical History  Procedure Date  . Cesarean section 04/16/2007    Family History  Problem Relation Age of Onset  . Diabetes Father    . Lung cancer Father   . Diabetes Mother   . Hypertension Mother     Social History History  Substance Use Topics  . Smoking status: Former Smoker    Quit date: 06/11/2004  . Smokeless tobacco: Never Used  . Alcohol Use: 1.0 - 1.5 oz/week    2-3 drink(s) per week     red wine    No Known Allergies  Current Outpatient Prescriptions  Medication Sig Dispense Refill  . acetaminophen (TYLENOL) 500 MG chewable tablet Chew 1,000 mg by mouth every 6 (six) hours as needed.      . buPROPion (WELLBUTRIN XL) 300 MG 24 hr tablet Take 300 mg by mouth daily.      . Chromium Picolinate 1000 MCG TABS Take 1 tablet by mouth daily.      . Cinnamon 500 MG capsule Take 500 mg by mouth daily.      . citalopram (CELEXA) 20 MG tablet Take 20 mg by mouth daily.      . Cyanocobalamin (B-12) 250 MCG TABS Take 1 tablet by mouth daily.      . ibuprofen (ADVIL,MOTRIN) 200 MG tablet Take 800 mg by mouth every 6 (six) hours as needed. pain      . insulin glargine (LANTUS) 100 UNIT/ML injection Inject 90 Units into the skin at bedtime.       .   loratadine (CLARITIN) 10 MG tablet Take 10 mg by mouth as needed.      . pseudoephedrine (SUDAFED) 30 MG tablet Take 30 mg by mouth every 4 (four) hours as needed.      . polyethylene glycol (GOLYTELY) 236 G solution Take 4,000 mLs by mouth once.  4000 mL  0    Review of Systems Review of Systems  Constitutional: Negative for fever, activity change, appetite change and unexpected weight change.  HENT: Positive for sneezing. Negative for hearing loss, nosebleeds, neck pain and neck stiffness.   Eyes: Negative for photophobia, redness and visual disturbance.  Respiratory: Negative for apnea, chest tightness and shortness of breath.        Uses inhaler prn  Cardiovascular: Negative for chest pain, palpitations and leg swelling.       Denies CP, SOB, DOE, orthopnea, pnd  Gastrointestinal: Negative for nausea, vomiting, abdominal pain, diarrhea, constipation and  abdominal distention.       Denies reflux, indigestion  Genitourinary: Negative for dysuria, hematuria, menstrual problem and pelvic pain.       Normal monthly menses   Musculoskeletal: Negative for arthralgias and gait problem.       B/l knee pain; rt ankle pain  Skin: Negative for pallor and rash.  Neurological: Negative for tremors, seizures, speech difficulty, light-headedness and headaches.  Hematological: Negative for adenopathy. Does not bruise/bleed easily.  Psychiatric/Behavioral: Negative for hallucinations, behavioral problems, sleep disturbance and self-injury.       Takes med for depression; good mood recently.     Blood pressure 124/82, pulse 94, temperature 97.8 F (36.6 C), temperature source Temporal, resp. rate 14, height 5' 7" (1.702 m), weight 285 lb 6.4 oz (129.457 kg), last menstrual period 08/13/2011.  Physical Exam Physical Exam  Vitals reviewed. Constitutional: She is oriented to person, place, and time. She appears well-developed and well-nourished.       Morbidly obese  HENT:  Head: Normocephalic and atraumatic.  Right Ear: External ear normal.  Left Ear: External ear normal.  Eyes: Conjunctivae are normal. No scleral icterus.  Neck: Normal range of motion. Neck supple. No JVD present. No tracheal deviation present. No thyromegaly present.  Cardiovascular: Normal rate, regular rhythm, normal heart sounds and intact distal pulses.   Pulmonary/Chest: No respiratory distress. She has no wheezes. She exhibits no tenderness.  Abdominal: Soft. Bowel sounds are normal. She exhibits no distension. There is no tenderness. There is no rebound.    Musculoskeletal: Normal range of motion. She exhibits no edema and no tenderness.  Lymphadenopathy:    She has no cervical adenopathy.  Neurological: She is alert and oriented to person, place, and time. She exhibits normal muscle tone.  Skin: Skin is warm and dry. No rash noted. No erythema.  Psychiatric: She has a  normal mood and affect. Her behavior is normal. Judgment and thought content normal.    Data Reviewed My note from 2011 and 2013 Abd u/s 09/2009 - gallstones Upper gi 09/2009 - normal  Psych note Letter of medical necessity Labs from 10/2009: A1C-7.7; normal cmet/cbc except for bld glucose of 250; lipid panel normal except for LDL 108  Assessment    Morbid obesity Dyslipidemia Asthma Insulin dependent diabetes mellitus Joint pain     Plan    Laparoscopic Roux-en-Y gastric bypass scheduled for 6/17. Her preop labs were reviewed. Other than some mild anemia there were no other abnormalities. SHe was given her bowel prep. Her remaining questions were asked and answered.  Rohit Deloria M.   Waldron Gerry, MD, FACS General, Bariatric, & Minimally Invasive Surgery [facility]        Avigdor Dollar M 09/03/2011, 2:11 PM    

## 2011-09-03 NOTE — Pre-Procedure Instructions (Signed)
Dr [name]-  PLEASE REVIEW ABNORMAL CMET FROM 09/03/11 [facility]

## 2011-09-04 ENCOUNTER — Encounter: Payer: BC Managed Care – PPO | Attending: General Surgery | Admitting: *Deleted

## 2011-09-04 DIAGNOSIS — Z713 Dietary counseling and surveillance: Secondary | ICD-10-CM | POA: Insufficient documentation

## 2011-09-04 DIAGNOSIS — Z01818 Encounter for other preprocedural examination: Secondary | ICD-10-CM | POA: Insufficient documentation

## 2011-09-04 NOTE — Pre-Procedure Instructions (Signed)
Pt states has bowel prep and will increase fluids day before surgery and nothing to eat or drink after midnight night before surgery

## 2011-09-04 NOTE — Patient Instructions (Signed)
Follow:   Pre-Op Diet per MD 2 weeks prior to surgery  Phase 2- Liquids (clear/full) 2 weeks after surgery  Vitamin/Mineral/Calcium guidelines for purchasing bariatric supplements  Exercise guidelines pre and post-op per MD  Follow-up at [facility] in 2 weeks post-op for diet advancement. Contact Marcayla Budge as needed with questions/concerns. 

## 2011-09-04 NOTE — Progress Notes (Signed)
  Bariatric Class:  Appt start time: 0830 end time:  0930.  Pre-Operative Nutrition Class  Patient was seen on 09/04/2011 for Pre-Operative Bariatric Surgery Education at the [facility].  Surgery date: 09/08/11 Surgery type: RYGB  Samples given per MNT protocol: Bariatric Advantage Multivitamin Lot # [id number] Exp: 09/13  Bariatric Advantage Calcium Citrate Lot # [id number] Exp: 09/13  Celebrate Vitamins Multivitamin Complete - Lot # [id number]; Exp: 11/14 Multivitamin - Lot # [id number]; Exp: 07/14  Celebrate Vitamins Iron 30 mg +C Lot # [id number] Exp:  07/14  Corliss Marcus Protein Powder Lot # [id number] Exp: 09/14  The following the learning objective met by the patient during this course:   Identifies Pre-Op Dietary Goals and will begin 2 weeks pre-operatively   Identifies appropriate sources of fluids and proteins   States protein recommendations and appropriate sources pre and post-operatively  Identifies Post-Operative Dietary Goals and will follow for 2 weeks post-operatively  Identifies appropriate multivitamin and calcium sources  Describes the need for physical activity post-operatively and will follow MD recommendations  States when to call healthcare provider regarding medication questions or post-operative complications  Handouts given during class include:  Pre-Op Bariatric Surgery Diet Handout  Protein Shake Handout  Post-Op Bariatric Surgery Nutrition Handout  BELT Program Information Flyer  Support Group Information Flyer  Follow-Up Plan: Patient will follow-up at [facility] 2 weeks post operatively for diet advancement per MD.

## 2011-09-08 ENCOUNTER — Ambulatory Visit (HOSPITAL_COMMUNITY): Payer: BC Managed Care – PPO | Admitting: Anesthesiology

## 2011-09-08 ENCOUNTER — Inpatient Hospital Stay (HOSPITAL_COMMUNITY)
Admission: RE | Admit: 2011-09-08 | Discharge: 2011-09-10 | DRG: 288 | Disposition: A | Discharge status: Home / Self Care | Payer: BC Managed Care – PPO | Source: Ambulatory Visit | Attending: General Surgery | Admitting: General Surgery

## 2011-09-08 ENCOUNTER — Encounter (HOSPITAL_COMMUNITY)
Admission: RE | Disposition: A | Discharge status: Home / Self Care | Payer: Self-pay | Source: Ambulatory Visit | Attending: General Surgery

## 2011-09-08 ENCOUNTER — Encounter (HOSPITAL_COMMUNITY): Payer: Self-pay | Admitting: Anesthesiology

## 2011-09-08 ENCOUNTER — Encounter (HOSPITAL_COMMUNITY): Payer: Self-pay | Admitting: *Deleted

## 2011-09-08 DIAGNOSIS — Z6841 Body Mass Index (BMI) 40.0 and over, adult: Secondary | ICD-10-CM

## 2011-09-08 DIAGNOSIS — E66813 Obesity, class 3: Secondary | ICD-10-CM | POA: Diagnosis present

## 2011-09-08 DIAGNOSIS — E785 Hyperlipidemia, unspecified: Secondary | ICD-10-CM | POA: Diagnosis present

## 2011-09-08 DIAGNOSIS — E119 Type 2 diabetes mellitus without complications: Secondary | ICD-10-CM

## 2011-09-08 DIAGNOSIS — Z87891 Personal history of nicotine dependence: Secondary | ICD-10-CM

## 2011-09-08 DIAGNOSIS — J45909 Unspecified asthma, uncomplicated: Secondary | ICD-10-CM | POA: Diagnosis present

## 2011-09-08 DIAGNOSIS — Z79899 Other long term (current) drug therapy: Secondary | ICD-10-CM

## 2011-09-08 DIAGNOSIS — Z01812 Encounter for preprocedural laboratory examination: Secondary | ICD-10-CM

## 2011-09-08 DIAGNOSIS — M2550 Pain in unspecified joint: Secondary | ICD-10-CM | POA: Diagnosis present

## 2011-09-08 DIAGNOSIS — Z794 Long term (current) use of insulin: Secondary | ICD-10-CM

## 2011-09-08 HISTORY — PX: GASTRIC ROUX-EN-Y: SHX5262

## 2011-09-08 LAB — GLUCOSE, CAPILLARY
Glucose-Capillary: 210 mg/dL — ABNORMAL HIGH (ref 70–99)
Glucose-Capillary: 207 mg/dL — ABNORMAL HIGH (ref 70–99)

## 2011-09-08 LAB — HEMOGLOBIN AND HEMATOCRIT, BLOOD
HCT: 31.8 % — ABNORMAL LOW (ref 36.0–46.0)
Hemoglobin: 10.3 g/dL — ABNORMAL LOW (ref 12.0–15.0)

## 2011-09-08 SURGERY — LAPAROSCOPIC ROUX-EN-Y GASTRIC
Anesthesia: General | Site: Abdomen | Wound class: Clean Contaminated

## 2011-09-08 MED ORDER — FIBRIN SEALANT COMPONENT 5 ML EX KIT
PACK | CUTANEOUS | Status: AC
  Filled 2011-09-08: qty 2

## 2011-09-08 MED ORDER — KETAMINE HCL 10 MG/ML IJ SOLN
INTRAMUSCULAR | Status: DC | PRN
  Administered 2011-09-08: 5 mg via INTRAVENOUS
  Administered 2011-09-08: 10 mg via INTRAVENOUS
  Administered 2011-09-08: 5 mg via INTRAVENOUS
  Administered 2011-09-08: 10 mg via INTRAVENOUS

## 2011-09-08 MED ORDER — BIOTENE DRY MOUTH MT LIQD
15.0000 mL | Freq: Two times a day (BID) | OROMUCOSAL | Status: DC
  Administered 2011-09-08 – 2011-09-09 (×3): 15 mL via OROMUCOSAL

## 2011-09-08 MED ORDER — ACETAMINOPHEN 160 MG/5ML PO SOLN: 650.0000 mg | ORAL | Status: DC | PRN

## 2011-09-08 MED ORDER — MORPHINE SULFATE 2 MG/ML IJ SOLN
2.0000 mg | INTRAMUSCULAR | Status: DC | PRN
  Administered 2011-09-08: 2 mg via INTRAVENOUS
  Administered 2011-09-08: 4 mg via INTRAVENOUS
  Administered 2011-09-09: 2 mg via INTRAVENOUS
  Administered 2011-09-09: 6 mg via INTRAVENOUS
  Administered 2011-09-09: 4 mg via INTRAVENOUS
  Administered 2011-09-09: 2 mg via INTRAVENOUS
  Filled 2011-09-08 (×2): qty 1
  Filled 2011-09-08: qty 2
  Filled 2011-09-08: qty 3
  Filled 2011-09-08: qty 1
  Filled 2011-09-08: qty 2

## 2011-09-08 MED ORDER — HEPARIN SODIUM (PORCINE) 5000 UNIT/ML IJ SOLN
INTRAMUSCULAR | Status: AC
  Administered 2011-09-08: 5000 [IU] via SUBCUTANEOUS
  Filled 2011-09-08: qty 1

## 2011-09-08 MED ORDER — OXYCODONE-ACETAMINOPHEN 5-325 MG/5ML PO SOLN
5.0000 mL | ORAL | Status: DC | PRN
  Administered 2011-09-09 (×2): 5 mL via ORAL
  Administered 2011-09-10: 10 mL via ORAL
  Administered 2011-09-10 (×2): 5 mL via ORAL
  Filled 2011-09-08: qty 5
  Filled 2011-09-08: qty 10
  Filled 2011-09-08 (×3): qty 5

## 2011-09-08 MED ORDER — BUPIVACAINE-EPINEPHRINE 0.25% -1:200000 IJ SOLN
INTRAMUSCULAR | Status: AC
  Filled 2011-09-08: qty 1

## 2011-09-08 MED ORDER — ACETAMINOPHEN 10 MG/ML IV SOLN
INTRAVENOUS | Status: DC | PRN
  Administered 2011-09-08: 1000 mg via INTRAVENOUS

## 2011-09-08 MED ORDER — ONDANSETRON HCL 4 MG/2ML IJ SOLN
4.0000 mg | INTRAMUSCULAR | Status: DC | PRN
  Administered 2011-09-08 – 2011-09-09 (×3): 4 mg via INTRAVENOUS
  Filled 2011-09-08 (×3): qty 2

## 2011-09-08 MED ORDER — LIDOCAINE HCL (CARDIAC) 20 MG/ML IV SOLN
INTRAVENOUS | Status: DC | PRN
  Administered 2011-09-08: 40 mg via INTRAVENOUS

## 2011-09-08 MED ORDER — KETOROLAC TROMETHAMINE 30 MG/ML IJ SOLN: 15.0000 mg | Freq: Once | INTRAMUSCULAR | Status: DC | PRN

## 2011-09-08 MED ORDER — FLUTICASONE-SALMETEROL 250-50 MCG/DOSE IN AEPB
1.0000 | INHALATION_SPRAY | Freq: Two times a day (BID) | RESPIRATORY_TRACT | Status: DC
  Administered 2011-09-09 – 2011-09-10 (×3): 1 via RESPIRATORY_TRACT
  Filled 2011-09-08: qty 14

## 2011-09-08 MED ORDER — LACTATED RINGERS IV SOLN
INTRAVENOUS | Status: DC | PRN
  Administered 2011-09-08 (×3): via INTRAVENOUS

## 2011-09-08 MED ORDER — HEPARIN SODIUM (PORCINE) 5000 UNIT/ML IJ SOLN
5000.0000 [IU] | INTRAMUSCULAR | Status: AC
  Administered 2011-09-08: 5000 [IU] via SUBCUTANEOUS

## 2011-09-08 MED ORDER — 0.9 % SODIUM CHLORIDE (POUR BTL) OPTIME
TOPICAL | Status: DC | PRN
  Administered 2011-09-08: 1000 mL

## 2011-09-08 MED ORDER — UNJURY CHICKEN SOUP POWDER: 2.0000 [oz_av] | Freq: Four times a day (QID) | ORAL | Status: DC

## 2011-09-08 MED ORDER — UNJURY VANILLA POWDER: 2.0000 [oz_av] | Freq: Four times a day (QID) | ORAL | Status: DC

## 2011-09-08 MED ORDER — HYDROMORPHONE HCL PF 1 MG/ML IJ SOLN: 0.2500 mg | INTRAMUSCULAR | Status: DC | PRN

## 2011-09-08 MED ORDER — STERILE WATER FOR IRRIGATION IR SOLN
Status: DC | PRN
  Administered 2011-09-08: 1500 mL

## 2011-09-08 MED ORDER — NEOSTIGMINE METHYLSULFATE 1 MG/ML IJ SOLN
INTRAMUSCULAR | Status: DC | PRN
  Administered 2011-09-08: 5 mg via INTRAVENOUS

## 2011-09-08 MED ORDER — PROMETHAZINE HCL 25 MG/ML IJ SOLN: 6.2500 mg | INTRAMUSCULAR | Status: DC | PRN

## 2011-09-08 MED ORDER — SUCCINYLCHOLINE CHLORIDE 20 MG/ML IJ SOLN
INTRAMUSCULAR | Status: DC | PRN
  Administered 2011-09-08: 120 mg via INTRAVENOUS

## 2011-09-08 MED ORDER — HYDROMORPHONE HCL PF 1 MG/ML IJ SOLN
INTRAMUSCULAR | Status: DC | PRN
  Administered 2011-09-08 (×4): 0.5 mg via INTRAVENOUS

## 2011-09-08 MED ORDER — UNJURY CHOCOLATE CLASSIC POWDER: 2.0000 [oz_av] | Freq: Four times a day (QID) | ORAL | Status: DC

## 2011-09-08 MED ORDER — LACTATED RINGERS IR SOLN
Status: DC | PRN
  Administered 2011-09-08: 3000 mL

## 2011-09-08 MED ORDER — BUPIVACAINE-EPINEPHRINE 0.25% -1:200000 IJ SOLN
INTRAMUSCULAR | Status: DC | PRN
  Administered 2011-09-08: 50 mL

## 2011-09-08 MED ORDER — ACETAMINOPHEN 10 MG/ML IV SOLN
1000.0000 mg | Freq: Four times a day (QID) | INTRAVENOUS | Status: AC
  Administered 2011-09-08 – 2011-09-09 (×4): 1000 mg via INTRAVENOUS
  Filled 2011-09-08 (×6): qty 100

## 2011-09-08 MED ORDER — FENTANYL CITRATE 0.05 MG/ML IJ SOLN
INTRAMUSCULAR | Status: DC | PRN
Start: 2011-09-08 — End: 2011-09-08
  Administered 2011-09-08 (×4): 50 ug via INTRAVENOUS
  Administered 2011-09-08: 100 ug via INTRAVENOUS
  Administered 2011-09-08: 50 ug via INTRAVENOUS
  Administered 2011-09-08: 100 ug via INTRAVENOUS
  Administered 2011-09-08 (×3): 50 ug via INTRAVENOUS

## 2011-09-08 MED ORDER — BUPIVACAINE-EPINEPHRINE PF 0.25-1:200000 % IJ SOLN
INTRAMUSCULAR | Status: AC
  Filled 2011-09-08: qty 30

## 2011-09-08 MED ORDER — CHLORHEXIDINE GLUCONATE 0.12 % MT SOLN
15.0000 mL | Freq: Two times a day (BID) | OROMUCOSAL | Status: DC
  Administered 2011-09-08 – 2011-09-09 (×2): 15 mL via OROMUCOSAL
  Filled 2011-09-08 (×4): qty 15

## 2011-09-08 MED ORDER — LABETALOL HCL 5 MG/ML IV SOLN
INTRAVENOUS | Status: DC | PRN
  Administered 2011-09-08: 10 mg via INTRAVENOUS

## 2011-09-08 MED ORDER — GLYCOPYRROLATE 0.2 MG/ML IJ SOLN
INTRAMUSCULAR | Status: DC | PRN
  Administered 2011-09-08: 0.6 mg via INTRAVENOUS

## 2011-09-08 MED ORDER — MIDAZOLAM HCL 5 MG/5ML IJ SOLN
INTRAMUSCULAR | Status: DC | PRN
  Administered 2011-09-08: 1 mg via INTRAVENOUS

## 2011-09-08 MED ORDER — ENOXAPARIN SODIUM 40 MG/0.4ML ~~LOC~~ SOLN
40.0000 mg | Freq: Two times a day (BID) | SUBCUTANEOUS | Status: DC
  Administered 2011-09-09 – 2011-09-10 (×3): 40 mg via SUBCUTANEOUS
  Filled 2011-09-08 (×5): qty 0.4

## 2011-09-08 MED ORDER — ACETAMINOPHEN 10 MG/ML IV SOLN
INTRAVENOUS | Status: AC
  Filled 2011-09-08: qty 100

## 2011-09-08 MED ORDER — CISATRACURIUM BESYLATE (PF) 10 MG/5ML IV SOLN
INTRAVENOUS | Status: DC | PRN
  Administered 2011-09-08: 4 mg via INTRAVENOUS
  Administered 2011-09-08: 2 mg via INTRAVENOUS
  Administered 2011-09-08 (×3): 4 mg via INTRAVENOUS
  Administered 2011-09-08: 12 mg via INTRAVENOUS

## 2011-09-08 MED ORDER — DEXTROSE 5 % IV SOLN
2.0000 g | INTRAVENOUS | Status: DC
  Filled 2011-09-08: qty 2

## 2011-09-08 MED ORDER — ONDANSETRON HCL 4 MG/2ML IJ SOLN
INTRAMUSCULAR | Status: DC | PRN
  Administered 2011-09-08 (×2): 2 mg via INTRAVENOUS

## 2011-09-08 MED ORDER — PROPOFOL 10 MG/ML IV EMUL
INTRAVENOUS | Status: DC | PRN
  Administered 2011-09-08: 220 mg via INTRAVENOUS

## 2011-09-08 MED ORDER — TISSEEL VH 10 ML EX KIT
PACK | CUTANEOUS | Status: DC | PRN
  Administered 2011-09-08: 1

## 2011-09-08 MED ORDER — POTASSIUM CHLORIDE IN NACL 20-0.45 MEQ/L-% IV SOLN
INTRAVENOUS | Status: DC
  Administered 2011-09-08: 125 mL via INTRAVENOUS
  Administered 2011-09-08 – 2011-09-10 (×4): via INTRAVENOUS
  Administered 2011-09-10: 125 mL via INTRAVENOUS
  Filled 2011-09-08 (×11): qty 1000

## 2011-09-08 SURGICAL SUPPLY — 91 items
ADH SKN CLS APL DERMABOND .7 (GAUZE/BANDAGES/DRESSINGS)
APL SKNCLS STERI-STRIP NONHPOA (GAUZE/BANDAGES/DRESSINGS) ×1
APL SRG 32X5 SNPLK LF DISP (MISCELLANEOUS) ×1
APPLICATOR COTTON TIP 6IN STRL (MISCELLANEOUS) ×4 IMPLANT
APPLIER CLIP ROT 13.4 12 LRG (CLIP)
APR CLP LRG 13.4X12 ROT 20 MLT (CLIP)
BAG SPEC RTRVL LRG 6X4 10 (ENDOMECHANICALS)
BENZOIN TINCTURE PRP APPL 2/3 (GAUZE/BANDAGES/DRESSINGS) ×1 IMPLANT
BLADE SURG 15 STRL LF DISP TIS (BLADE) IMPLANT
BLADE SURG 15 STRL SS (BLADE)
BLADE SURG SZ11 CARB STEEL (BLADE) ×2 IMPLANT
CABLE HIGH FREQUENCY MONO STRZ (ELECTRODE) ×2 IMPLANT
CANISTER SUCTION 2500CC (MISCELLANEOUS) ×2 IMPLANT
CLIP APPLIE ROT 13.4 12 LRG (CLIP) IMPLANT
CLIP SUT LAPRA TY ABSORB (SUTURE) ×4 IMPLANT
CLOTH BEACON ORANGE TIMEOUT ST (SAFETY) ×2 IMPLANT
COVER SURGICAL LIGHT HANDLE (MISCELLANEOUS) ×2 IMPLANT
CUTTER LINEAR ENDO ART 45 ETS (STAPLE) IMPLANT
DERMABOND ADVANCED (GAUZE/BANDAGES/DRESSINGS)
DERMABOND ADVANCED .7 DNX12 (GAUZE/BANDAGES/DRESSINGS) IMPLANT
DEVICE SUTURE ENDOST 10MM (ENDOMECHANICALS) ×2 IMPLANT
DISSECTOR BLUNT TIP ENDO 5MM (MISCELLANEOUS) IMPLANT
DRAIN PENROSE 18X1/4 LTX STRL (WOUND CARE) ×2 IMPLANT
DRAPE CAMERA CLOSED 9X96 (DRAPES) ×2 IMPLANT
DRAPE UTILITY XL STRL (DRAPES) ×2 IMPLANT
DRSG TEGADERM 2-3/8X2-3/4 SM (GAUZE/BANDAGES/DRESSINGS) ×1 IMPLANT
ELECT REM PT RETURN 9FT ADLT (ELECTROSURGICAL) ×2
ELECTRODE REM PT RTRN 9FT ADLT (ELECTROSURGICAL) ×1 IMPLANT
GAUZE SPONGE 2X2 8PLY STRL LF (GAUZE/BANDAGES/DRESSINGS) IMPLANT
GAUZE SPONGE 4X4 16PLY XRAY LF (GAUZE/BANDAGES/DRESSINGS) ×2 IMPLANT
GLOVE BIO SURGEON STRL SZ7.5 (GLOVE) ×2 IMPLANT
GLOVE BIOGEL M STRL SZ7.5 (GLOVE) IMPLANT
GLOVE BIOGEL PI IND STRL 7.0 (GLOVE) ×1 IMPLANT
GLOVE BIOGEL PI INDICATOR 7.0 (GLOVE) ×1
GLOVE INDICATOR 8.0 STRL GRN (GLOVE) ×2 IMPLANT
GOWN STRL NON-REIN LRG LVL3 (GOWN DISPOSABLE) ×2 IMPLANT
GOWN STRL REIN XL XLG (GOWN DISPOSABLE) ×4 IMPLANT
HEMOSTAT SURGICEL 4X8 (HEMOSTASIS) IMPLANT
HOVERMATT SINGLE USE (MISCELLANEOUS) ×2 IMPLANT
KIT BASIN OR (CUSTOM PROCEDURE TRAY) ×2 IMPLANT
KIT GASTRIC LAVAGE 34FR ADT (SET/KITS/TRAYS/PACK) ×2 IMPLANT
NDL SPNL 22GX3.5 QUINCKE BK (NEEDLE) ×1 IMPLANT
NEEDLE SPNL 22GX3.5 QUINCKE BK (NEEDLE) ×2 IMPLANT
NS IRRIG 1000ML POUR BTL (IV SOLUTION) ×2 IMPLANT
PACK CARDIOVASCULAR III (CUSTOM PROCEDURE TRAY) ×2 IMPLANT
PEN SKIN MARKING BROAD (MISCELLANEOUS) ×2 IMPLANT
POUCH SPECIMEN RETRIEVAL 10MM (ENDOMECHANICALS) IMPLANT
RELOAD 45 VASCULAR/THIN (ENDOMECHANICALS) ×2 IMPLANT
RELOAD BLUE (STAPLE) ×2 IMPLANT
RELOAD ENDO STITCH 2.0 (ENDOMECHANICALS) ×18
RELOAD GOLD (STAPLE) ×1 IMPLANT
RELOAD STAPLE 45 2.5 WHT GRN (ENDOMECHANICALS) IMPLANT
RELOAD STAPLE 45 3.5 BLU ETS (ENDOMECHANICALS) IMPLANT
RELOAD STAPLE TA45 3.5 REG BLU (ENDOMECHANICALS) ×6 IMPLANT
RELOAD SUT SNGL STCH ABSRB 2-0 (ENDOMECHANICALS) IMPLANT
RELOAD SUT SNGL STCH BLK 2-0 (ENDOMECHANICALS) IMPLANT
RELOAD WHITE ECR60W (STAPLE) ×1 IMPLANT
SCALPEL HARMONIC ACE (MISCELLANEOUS) ×2 IMPLANT
SCISSORS LAP 5X35 DISP (ENDOMECHANICALS) ×2 IMPLANT
SEALANT SURGICAL APPL DUAL CAN (MISCELLANEOUS) ×2 IMPLANT
SET IRRIG TUBING LAPAROSCOPIC (IRRIGATION / IRRIGATOR) ×2 IMPLANT
SLEEVE ADV FIXATION 12X100MM (TROCAR) ×2 IMPLANT
SLEEVE ADV FIXATION 5X100MM (TROCAR) ×1 IMPLANT
SLEEVE ENDOPATH XCEL 5M (ENDOMECHANICALS) ×2 IMPLANT
SOLUTION ANTI FOG 6CC (MISCELLANEOUS) ×2 IMPLANT
SPONGE GAUZE 2X2 STER 10/PKG (GAUZE/BANDAGES/DRESSINGS) ×1
SPONGE GAUZE 4X4 12PLY (GAUZE/BANDAGES/DRESSINGS) ×2 IMPLANT
STAPLE ECHEON FLEX 60 POW ENDO (STAPLE) ×2 IMPLANT
STAPLER VISISTAT 35W (STAPLE) ×2 IMPLANT
STRIP CLOSURE SKIN 1/2X4 (GAUZE/BANDAGES/DRESSINGS) ×1 IMPLANT
SUT ETHILON 3 0 PS 1 (SUTURE) IMPLANT
SUT MNCRL AB 4-0 PS2 18 (SUTURE) ×2 IMPLANT
SUT RELOAD ENDO STITCH 2 48X1 (ENDOMECHANICALS) ×5
SUT RELOAD ENDO STITCH 2.0 (ENDOMECHANICALS) ×4
SUT SILK 2 0 SH (SUTURE) IMPLANT
SUT VIC AB 2-0 SH 27 (SUTURE) ×2
SUT VIC AB 2-0 SH 27X BRD (SUTURE) ×1 IMPLANT
SUTURE RELOAD END STTCH 2 48X1 (ENDOMECHANICALS) ×5 IMPLANT
SUTURE RELOAD ENDO STITCH 2.0 (ENDOMECHANICALS) ×4 IMPLANT
SYR 20CC LL (SYRINGE) ×2 IMPLANT
SYR 50ML LL SCALE MARK (SYRINGE) ×2 IMPLANT
SYR CONTROL 10ML LL (SYRINGE) ×2 IMPLANT
TOWEL OR 17X26 10 PK STRL BLUE (TOWEL DISPOSABLE) ×2 IMPLANT
TRAY FOLEY CATH 14FRSI W/METER (CATHETERS) ×2 IMPLANT
TROCAR BALLN 12MMX100 BLUNT (TROCAR) ×1 IMPLANT
TROCAR BLADELESS OPT 5 100 (ENDOMECHANICALS) ×2 IMPLANT
TROCAR ENDOPATH XCEL 12X100 BL (ENDOMECHANICALS) ×6 IMPLANT
TROCAR XCEL 12X100 BLDLESS (ENDOMECHANICALS) ×2 IMPLANT
TUBING ENDO SMARTCAP (MISCELLANEOUS) ×2 IMPLANT
TUBING FILTER THERMOFLATOR (ELECTROSURGICAL) ×2 IMPLANT
WATER STERILE IRR 1500ML POUR (IV SOLUTION) ×2 IMPLANT

## 2011-09-08 NOTE — Anesthesia Preprocedure Evaluation (Signed)
Anesthesia Evaluation  Patient identified by MRN, date of birth, ID band Patient awake    Reviewed: Allergy & Precautions, H&P , NPO status , Patient's Chart, lab work & pertinent test results  Airway Mallampati: II TM Distance: <3 FB Neck ROM: Full    Dental No notable dental hx.    Pulmonary neg pulmonary ROS,  breath sounds clear to auscultation  Pulmonary exam normal       Cardiovascular negative cardio ROS  Rhythm:Regular Rate:Normal     Neuro/Psych negative neurological ROS  negative psych ROS   GI/Hepatic negative GI ROS, Neg liver ROS,   Endo/Other  Diabetes mellitus-, Insulin DependentHypothyroidism Morbid obesity  Renal/GU negative Renal ROS  negative genitourinary   Musculoskeletal negative musculoskeletal ROS (+)   Abdominal   Peds negative pediatric ROS (+)  Hematology negative hematology ROS (+)   Anesthesia Other Findings   Reproductive/Obstetrics negative OB ROS                           Anesthesia Physical Anesthesia Plan  ASA: III  Anesthesia Plan: General   Post-op Pain Management:    Induction: Intravenous  Airway Management Planned: Oral ETT  Additional Equipment:   Intra-op Plan:   Post-operative Plan: Extubation in OR  Informed Consent: I have reviewed the patients History and Physical, chart, labs and discussed the procedure including the risks, benefits and alternatives for the proposed anesthesia with the patient or authorized representative who has indicated his/her understanding and acceptance.   Dental advisory given  Plan Discussed with: CRNA  Anesthesia Plan Comments:         Anesthesia Quick Evaluation

## 2011-09-08 NOTE — Op Note (Signed)
Preop diagnosis:  1. Morbid obesity (BMI 44.7)  2. Insulin dependent diabetes mellitus 3. Hyperlipidemia 4. Anemia 5. Joint pain  Postop diagnosis:  1. same  Surgical procedure: Laparoscopic Roux-en-Y gastric bypass; EGD  Surgeon: Atilano Ina, M.D. FACS  Asst.: Ovidio Kin, MD, FACS  Anesthesia: General plus 50 cc 0.25% marcaine with epi  Complications: None   EBL: Minimal   Drains: None   Disposition: PACU in good condition   Indications for procedure: 41yo AAF with morbid obesity who has been unsuccessful at sustained weight loss. The patient's comorbidities are listed above. We discussed the risk and benefits of surgery including but not limited to anesthesia risk, bleeding, infection, blood clot formation, anastomotic leak, anastomotic stricture, ulcer formation, death, respiratory complications, intestinal blockage, internal hernia, gallstone formation, vitamin and nutritional deficiencies, injury to surrounding structures, failure to lose weight and mood changes.   Description of procedure: Patient is brought to the operating room and general anesthesia induced. The patient had received preoperative broad-spectrum IV antibiotics and subcutaneous heparin. The abdomen was widely sterilely prepped with Chloraprep and draped. Patient timeout was performed and correct patient and procedure confirmed. Access was obtained with a 12 mm Optiview trocar in the left upper quadrant and pneumoperitoneum established without difficulty. There was some omental adhesions along the lower midline.  A 5 mm trocar was placed laterally in the left upper quadrant. The adhesions were then taken down with the Harmonic scalpel. Under direct vision 12 mm trocars were placed laterally in the right upper quadrant, right upper quadrant midclavicular line, and to the left and above the umbilicus for the camera port.   The omentum was brought into the upper abdomen and the transverse mesocolon elevated and the  ligament of Treitz clearly identified. A 40 cm biliopancreatic limb was then carefully measured from the ligament of Treitz. The small intestine was divided at this point with a single firing of the white load linear stapler. A Penrose drain was sutured to the end of the Roux-en-Y limb for later identification. A 100 cm Roux-en-Y limb was then carefully measured. At this point a side-to-side anastomosis was created between the Roux limb and the end of the biliopancreatic limb. This was accomplished with a single firing of the 45 mm white load linear stapler. The common enterotomy was closed with a running 2-0 Vicryl begun at either end of the enterotomy and tied centrally. The mesenteric defect was then closed with running 2-0 silk. Tiseel tissue sealant was placed over the anastomosis. The omentum was then divided with the harmonic scalpel up towards the transverse colon to allow mobility of the Roux limb toward the gastric pouch. The patient was then placed in steep reversed Trendelenburg. Through a 5 mm subxiphoid site the [facility] retractor was placed and the left lobe of the liver elevated with excellent exposure of the upper stomach and hiatus. The angle of Hiss was then mobilized with the harmonic scalpel. A 4 cm gastric pouch was then carefully measured along the lesser curve of the stomach. Dissection was carried along the lesser curve at this point with the Harmonic scalpel working carefully back toward the lesser sac at right angles to the lesser curve. The free lesser sac was then entered. After being sure all tubes were removed from the stomach an initial firing of the gold load 60 mm linear stapler was fired at right angles across the lesser curve for about 4 cm. The gastric pouch was further mobilized posteriorly and then the pouch was  completed with 2 further firings of the 60 mm blue load linear stapler and 2 firings of a blue load 45mm linear stapler up through the previously dissected angle of  His. It was ensured that the pouch was completely mobilized away from the gastric remnant. This created a nice tubular 4-5 cm gastric pouch. The Roux limb was then brought up in an antecolic fashion with the candycane facing to the patient's left without undue tension. The gastrojejunostomy was created with an initial posterior row of 2-0 Vicryl between the Roux limb and the staple line of the gastric pouch. Enterotomies were then made in the gastric pouch and the Roux limb with the harmonic scalpel and at approximately 2-2-1/2 cm anastomosis was created with a single firing of the 45mm blue load linear stapler. The staple line was inspected and was intact without bleeding. The common enterotomy was then closed with running 2-0 Vicryl begun at either end and tied centrally. The Ewall tube was then easily passed through the anastomosis and an outer anterior layer of running 2-0 Vicryl was placed. The Ewald tube was removed. With the outlet of the gastrojejunostomy clamped and under saline irrigation the assistant performed upper endoscopy and with the gastric pouch tensely distended with air-there was no evidence of leak on this test. The pouch was desufflated. The Vonita Moss defect was closed with running 2-0 silk. The abdomen was inspected for any evidence of bleeding or bowel injury and everything looked fine. The Nathanson retractor was removed under direct vision after coating the anastomosis with Tisseel tissue sealant. All CO2 was evacuated and trochars removed. Skin incisions were closed 4-0 monocryl in a subcuticular layer followed by benzoin, steri-strips, and bandages. Sponge needle and instrument counts were correct. The patient was taken to the PACU in good condition.    Virginia Stewart. [name], MD, FACS General, Bariatric, & Minimally Invasive Surgery [facility], [place]

## 2011-09-08 NOTE — Anesthesia Postprocedure Evaluation (Signed)
  Anesthesia Post-op Note  Patient: Virginia Stewart  Procedure(s) Performed: Procedure(s) (LRB): LAPAROSCOPIC ROUX-EN-Y GASTRIC (N/A)  Patient Location: PACU  Anesthesia Type: General  Level of Consciousness: awake and alert   Airway and Oxygen Therapy: Patient Spontanous Breathing  Post-op Pain: mild  Post-op Assessment: Post-op Vital signs reviewed, Patient's Cardiovascular Status Stable, Respiratory Function Stable, Patent Airway and No signs of Nausea or vomiting  Post-op Vital Signs: stable  Complications: No apparent anesthesia complications

## 2011-09-08 NOTE — Transfer of Care (Signed)
Immediate Anesthesia Transfer of Care Note  Patient: Virginia Stewart  Procedure(s) Performed: Procedure(s) (LRB): LAPAROSCOPIC ROUX-EN-Y GASTRIC (N/A)  Patient Location: PACU  Anesthesia Type: General  Level of Consciousness: sedated  Airway & Oxygen Therapy: Patient Spontanous Breathing and Patient connected to face mask oxygen  Post-op Assessment: Report given to PACU RN, Post -op Vital signs reviewed and stable and Patient moving all extremities  Post vital signs: Reviewed and stable  Complications: No apparent anesthesia complications

## 2011-09-08 NOTE — Progress Notes (Signed)
Page Dr Okey Dupre of anesthesia about CBG of 210 mg/dl. Dr Okey Dupre states he will treat this blood sugar intraop.

## 2011-09-08 NOTE — Interval H&P Note (Signed)
History and Physical Interval Note:  09/08/2011 10:53 AM  Virginia Stewart  has presented today for surgery, with the diagnosis of morbid obesity   The various methods of treatment have been discussed with the patient and family. After consideration of risks, benefits and other options for treatment, the patient has consented to  Procedure(s) (LRB): LAPAROSCOPIC ROUX-EN-Y GASTRIC (N/A) as a surgical intervention .  The patients' history has been reviewed, patient examined, no change in status, stable for surgery.  I have reviewed the patients' chart and labs.  Questions were answered to the patient's satisfaction.    [name]. [name], MD, FACS General, Bariatric, & Minimally Invasive Surgery [facility], [place]   [facility]

## 2011-09-08 NOTE — H&P (View-Only) (Signed)
Patient ID: Virginia Stewart, female   DOB: 11/21/1970, 41 y.o.   MRN: [id number]  Chief Complaint  Patient presents with  . Bariatric Pre-op    Sx on 09/08/11    HPI Virginia Stewart is a 41 y.o. female.   HPI 41 year old morbidly obese African American female comes in for her preop appt for her upcoming gastric bypass surgery. I initially saw her in the summer of 2011. She was actually approved for laparoscopic adjustable gastric band placement surgery that fall. However she had an outstanding debt and lost her job and therefore she postponed surgery.  Since that time, she states that her diabetes has worsened. She is now on insulin. Her insulin requirement is now around 95 units at night. She denies any new surgeries since her last visit.  Despite numerous attempts for sustained weight loss she has been unsuccessful. She has tried the [employer], [employer], [employer], Slim fast, phentermine, all without any long-term success. One of her main goals for weight loss surgery is improvement in her diabetes. She is still interested in lap band surgery over gastric bypass surgery. 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  She denies any changes since her last visit in April.   She has stopped taking the claritin and sudafed since she started her pre-op diet last week. She is walking at least 3 times a week.  PMHx, PSHx, SOCHx, FAMHx, ALL reviewed and unchanged  Past Medical History  Diagnosis Date  . Insulin dependent type 2 diabetes mellitus   . Hyperlipidemia   . Depression   . Diabetes mellitus 2003  . Anginal pain 8/12    chest x ray, ekg epic- states was told anxiety attack- none since  . Asthma     with seasonal allergies  . Headache   . Anemia     borderline  . Hypothyroidism     borderline    Past Surgical History  Procedure Date  . Cesarean section 04/16/2007    Family History  Problem Relation Age of Onset  . Diabetes Father    . Lung cancer Father   . Diabetes Mother   . Hypertension Mother     Social History History  Substance Use Topics  . Smoking status: Former Smoker    Quit date: 06/11/2004  . Smokeless tobacco: Never Used  . Alcohol Use: 1.0 - 1.5 oz/week    2-3 drink(s) per week     red wine    No Known Allergies  Current Outpatient Prescriptions  Medication Sig Dispense Refill  . acetaminophen (TYLENOL) 500 MG chewable tablet Chew 1,000 mg by mouth every 6 (six) hours as needed.      Marland Kitchen buPROPion (WELLBUTRIN XL) 300 MG 24 hr tablet Take 300 mg by mouth daily.      . Chromium Picolinate 1000 MCG TABS Take 1 tablet by mouth daily.      . Cinnamon 500 MG capsule Take 500 mg by mouth daily.      . citalopram (CELEXA) 20 MG tablet Take 20 mg by mouth daily.      . Cyanocobalamin (B-12) 250 MCG TABS Take 1 tablet by mouth daily.      Marland Kitchen ibuprofen (ADVIL,MOTRIN) 200 MG tablet Take 800 mg by mouth every 6 (six) hours as needed. pain      . insulin glargine (LANTUS) 100 UNIT/ML injection Inject 90 Units into the skin at bedtime.       Marland Kitchen  loratadine (CLARITIN) 10 MG tablet Take 10 mg by mouth as needed.      . pseudoephedrine (SUDAFED) 30 MG tablet Take 30 mg by mouth every 4 (four) hours as needed.      . polyethylene glycol (GOLYTELY) 236 G solution Take 4,000 mLs by mouth once.  4000 mL  0    Review of Systems Review of Systems  Constitutional: Negative for fever, activity change, appetite change and unexpected weight change.  HENT: Positive for sneezing. Negative for hearing loss, nosebleeds, neck pain and neck stiffness.   Eyes: Negative for photophobia, redness and visual disturbance.  Respiratory: Negative for apnea, chest tightness and shortness of breath.        Uses inhaler prn  Cardiovascular: Negative for chest pain, palpitations and leg swelling.       Denies CP, SOB, DOE, orthopnea, pnd  Gastrointestinal: Negative for nausea, vomiting, abdominal pain, diarrhea, constipation and  abdominal distention.       Denies reflux, indigestion  Genitourinary: Negative for dysuria, hematuria, menstrual problem and pelvic pain.       Normal monthly menses   Musculoskeletal: Negative for arthralgias and gait problem.       B/l knee pain; rt ankle pain  Skin: Negative for pallor and rash.  Neurological: Negative for tremors, seizures, speech difficulty, light-headedness and headaches.  Hematological: Negative for adenopathy. Does not bruise/bleed easily.  Psychiatric/Behavioral: Negative for hallucinations, behavioral problems, sleep disturbance and self-injury.       Takes med for depression; good mood recently.     Blood pressure 124/82, pulse 94, temperature 97.8 F (36.6 C), temperature source Temporal, resp. rate 14, height 5\' 7"  (1.702 m), weight 285 lb 6.4 oz (129.457 kg), last menstrual period 08/13/2011.  Physical Exam Physical Exam  Vitals reviewed. Constitutional: She is oriented to person, place, and time. She appears well-developed and well-nourished.       Morbidly obese  HENT:  Head: Normocephalic and atraumatic.  Right Ear: External ear normal.  Left Ear: External ear normal.  Eyes: Conjunctivae are normal. No scleral icterus.  Neck: Normal range of motion. Neck supple. No JVD present. No tracheal deviation present. No thyromegaly present.  Cardiovascular: Normal rate, regular rhythm, normal heart sounds and intact distal pulses.   Pulmonary/Chest: No respiratory distress. She has no wheezes. She exhibits no tenderness.  Abdominal: Soft. Bowel sounds are normal. She exhibits no distension. There is no tenderness. There is no rebound.    Musculoskeletal: Normal range of motion. She exhibits no edema and no tenderness.  Lymphadenopathy:    She has no cervical adenopathy.  Neurological: She is alert and oriented to person, place, and time. She exhibits normal muscle tone.  Skin: Skin is warm and dry. No rash noted. No erythema.  Psychiatric: She has a  normal mood and affect. Her behavior is normal. Judgment and thought content normal.    Data Reviewed My note from 2011 and 2013 Abd u/s 09/2009 - gallstones Upper gi 09/2009 - normal  Psych note Letter of medical necessity Labs from 10/2009: A1C-7.7; normal cmet/cbc except for bld glucose of 250; lipid panel normal except for LDL 108  Assessment    Morbid obesity Dyslipidemia Asthma Insulin dependent diabetes mellitus Joint pain     Plan    Laparoscopic Roux-en-Y gastric bypass scheduled for 6/17. Her preop labs were reviewed. Other than some mild anemia there were no other abnormalities. SHe was given her bowel prep. Her remaining questions were asked and answered.  [name].  [name], MD, FACS General, Bariatric, & Minimally Invasive Surgery [facility], [place]        [facility] M 09/03/2011, 2:11 PM

## 2011-09-09 ENCOUNTER — Encounter (HOSPITAL_COMMUNITY): Payer: Self-pay | Admitting: General Surgery

## 2011-09-09 ENCOUNTER — Inpatient Hospital Stay (HOSPITAL_COMMUNITY): Payer: BC Managed Care – PPO

## 2011-09-09 LAB — HEMOGLOBIN AND HEMATOCRIT, BLOOD: Hemoglobin: 9.4 g/dL — ABNORMAL LOW (ref 12.0–15.0)

## 2011-09-09 LAB — DIFFERENTIAL
Basophils Absolute: 0 10*3/uL (ref 0.0–0.1)
Basophils Relative: 0 % (ref 0–1)
Lymphocytes Relative: 18 % (ref 12–46)
Monocytes Absolute: 0.8 10*3/uL (ref 0.1–1.0)
Neutro Abs: 8 10*3/uL — ABNORMAL HIGH (ref 1.7–7.7)
Neutrophils Relative %: 74 % (ref 43–77)

## 2011-09-09 LAB — CBC
MCHC: 31.3 g/dL (ref 30.0–36.0)
Platelets: 259 10*3/uL (ref 150–400)
RDW: 14.7 % (ref 11.5–15.5)
WBC: 10.8 10*3/uL — ABNORMAL HIGH (ref 4.0–10.5)

## 2011-09-09 LAB — GLUCOSE, CAPILLARY: Glucose-Capillary: 150 mg/dL — ABNORMAL HIGH (ref 70–99)

## 2011-09-09 MED ORDER — HYDROMORPHONE HCL PF 1 MG/ML IJ SOLN: 0.5000 mg | INTRAMUSCULAR | Status: DC | PRN

## 2011-09-09 MED ORDER — INSULIN ASPART 100 UNIT/ML ~~LOC~~ SOLN
0.0000 [IU] | SUBCUTANEOUS | Status: DC
  Administered 2011-09-09 – 2011-09-10 (×7): 3 [IU] via SUBCUTANEOUS

## 2011-09-09 MED ORDER — IOHEXOL 300 MG/ML  SOLN
50.0000 mL | Freq: Once | INTRAMUSCULAR | Status: AC | PRN
  Administered 2011-09-09: 50 mL via ORAL

## 2011-09-09 MED ORDER — CHLORHEXIDINE GLUCONATE 0.12 % MT SOLN
15.0000 mL | Freq: Two times a day (BID) | OROMUCOSAL | Status: DC
  Administered 2011-09-09 – 2011-09-10 (×2): 15 mL via OROMUCOSAL
  Filled 2011-09-09: qty 15

## 2011-09-09 MED ORDER — PANTOPRAZOLE SODIUM 40 MG IV SOLR
40.0000 mg | INTRAVENOUS | Status: DC
  Administered 2011-09-09 – 2011-09-10 (×2): 40 mg via INTRAVENOUS
  Filled 2011-09-09 (×2): qty 40

## 2011-09-09 NOTE — Op Note (Signed)
Virginia Stewart, Virginia Stewart               ACCOUNT NO.:  [id number]  MEDICAL RECORD NO.:  [id number]  LOCATION:  1527                         FACILITY:  [facility]  PHYSICIAN:  Sandria Bales. Ezzard Standing, M.D.  DATE OF BIRTH:  January 22, 1971  DATE OF PROCEDURE:  09/08/2011                              OPERATIVE REPORT   PREOPERATIVE DIAGNOSIS:  Status post Roux-en-Y gastric bypass.  POSTOPERATIVE DIAGNOSIS:  Status post Roux-en-Y gastric bypass, normal upper endoscopy.  PROCEDURE:  Esophagogastrojejunoscopy.  SURGEON:  Sandria Bales. Ezzard Standing, M.D.  FIRST ASSISTANT:  None.  INDICATIONS FOR PROCEDURE:  Virginia Stewart is a 41 year old African American female who is undergoing a Roux-en-Y gastric bypass by Dr. Gaynelle Adu for morbid obesity.  He has completed a Roux-en-Y gastric bypass and I am doing the upper endoscopy to evaluate the gastric pouch and the anastomosis.  OPERATIVE NOTE:  With the patient in supine position under general anesthesia, Dr. [name] has a laparoscope and has clamped off the small Bowel just beyond the gastrojejunal anastomosis.  I took the Olympus endoscope and passed it without difficulty down the back of her throat into her stomach pouch.  Her esophagogastric junction is at about 38 cm.  The pouch was small, maybe 3 or 4 cm with the gastrojejunal anastomosis at about 41-42 cm.  I was able to pass the scope through the anastomosis, but the pouch was small, it is viable, there was no bleeding.  With Dr. [name] clamping the small bowel, he then flooded the upper abdomen with saline.  There were no evidence of bubbles.  I saw no evidence of leak either.  This was felt to be a normal post Roux-en-Y gastric bypass endoscopy.  The scope was then slowly withdrawn into the esophagus which was unremarkable and then removed.  The patient tolerated the procedure well.  Dr. [name] will dictate the majority of the bypass operation.   Sandria Bales. Ezzard Standing, M.D.   DHN/MEDQ  D:  09/08/2011  T:   09/09/2011  Job:  [id number]  cc:   Dr. Pollyann Kennedy

## 2011-09-09 NOTE — Progress Notes (Signed)
UGI results called to Dr. [name]; orders received to begin POD #1 diet; Trula Ore, RN aware of results and orders. Talmadge Chad, RN Bariatric Nurse Coordinator

## 2011-09-09 NOTE — Progress Notes (Signed)
Pt alert and oriented; VSS; c/o nausea but denies vomiting; c/o dry heaves; using prn meds with relief; denies flatus or BM; voiding without difficulty; ambulating to BR well; encouraged ambulation in hallways today and pt verbalized understanding of; encouraged use of incentive spirometer; c/o abdominal soreness with relief from prn pain meds; remains NPO; awaiting UGI; pt already has follow up appts with [facility] and [facility]; discharge instructions given for pt to review. GASTRIC BYPASS/SLEEVE DISCHARGE INSTRUCTIONS  Drs. Fredrik Rigger, [place], [place], and [place] Call if you have any problems.   Call [phone] and ask for the surgeon on call.    If you need immediate assistance come to the ER at [facility]. Tell the ER personnel that you are a new post-op gastric bypass patient. Signs and symptoms to report:   Severe vomiting or nausea. If you cannot tolerate clear liquids for longer than 1 day, you need to call your surgeon.    Abdominal pain which does not get better after taking your pain medication   Fever greater than 101 F degree   Difficulty breathing   Chest pain    Redness, swelling, drainage, or foul odor at incision sites    If your incisions open or pull apart   Swelling or pain in calf (lower leg)   Diarrhea, frequent watery, uncontrolled bowel movements.   Constipation, (no bowel movements for 3 days) if this occurs, Take Milk of Magnesia, 2 tablespoons by mouth, 3 times a day for 2 days if needed.  Call your doctor if constipation continues. Stop taking Milk of Magnesia once you have had a bowel movement. You may also use Miralax according to the label instructions.   Anything you consider "abnormal for you".   Normal side effects after Surgery:   Unable to sleep at night or concentrate   Irritability   Being tearful (crying) or depressed   These are common complaints, possibly related to your anesthesia, stress of surgery and change in lifestyle, that usually go away a few  weeks after surgery.  If these feelings continue, call your medical doctor.  Wound Care You may have surgical glue, steri-strips, or staples over your incisions after surgery.  Surgical glue:  Looks like a clear film over your incisions and will wear off gradually. Steri-strips: Strips of tape over your incisions. You may notice a yellowish color on the skin underneath the steri-strips. This is a substance used to make the steri-strips stick better. Do not pull the steri-strips off - let them fall off.  Staples: Cherlynn Polo may be removed before you leave the hospital. If you go home with staples, call [facility] [phone]) for an appointment with your surgeon's nurse to have staples removed in 7 - 10 days. Showering: You may shower two days after your surgery unless otherwise instructed by your surgeon. Wash gently around wounds with warm soapy water, rinse well, and gently pat dry.  If you have a drain, you may need someone to hold this while you shower. Avoid tub baths until staples are removed and incisions are healed.    Medications   Medications should be liquid or crushed if larger than the size of a dime.  Extended release pills should not be crushed.   Depending on the size and number of medications you take, you may need to stagger/change the time you take your medications so that you do not over-fill your pouch.    Make sure you follow-up with your primary care physician to make medication  adjustments needed during rapid weight loss and life-style adjustment.   If you are diabetic, follow up with the doctor that prescribes your diabetes medication(s) within one week after surgery and check your blood sugar regularly.   Do not drive while taking narcotics!   Do not take acetaminophen (Tylenol) and Roxicet or Lortab Elixir at the same time since these pain medications contain acetaminophen.  Diet at home: (First 2 Weeks) You will see the nutritionist two weeks after your  surgery. She will advance your diet if you are tolerating liquids well. Once at home, if you have severe vomiting or nausea and cannot tolerate clear liquids lasting longer than 1 day, call your surgeon.  Begin high protein shake 2 ounces every 3 hours, 5 - 6 times per day.  Gradually increase the amount you drink as tolerated.  You may find it easier to slowly sip shakes throughout the day.  It is important to get your proteins in first.   Protein Shake   Drink at least 2 ounces of shake 5-6 times per day   Each serving of protein shakes should have a minimum of 15 grams of protein and no more than 5 grams of carbohydrate    Increase the amount of protein shake you drink as tolerated   Protein powder may be added to fluids such as non-fat milk or Lactaid milk (limit to 20 grams added protein powder per serving   The initial goal is to drink at least 8 ounces of protein shake/drink per day (or as directed by the nutritionist). Some examples of protein shakes are [employer], [employer], E[employer] Edge HP, and [employer]. Hydration   Gradually increase the amount of water and other liquids as tolerated (See Acceptable Fluids)   Gradually increase the amount of protein shake as tolerated     Sip fluids slowly and throughout the day   May use Sugar substitutes, use sparingly (limit to 6 - 8 packets per day). Your fluid goal is 64 ounces of fluid daily. It may take a few weeks to build up to this.         32 oz (or more) should be clear liquids and 32 oz (or more) should be full liquids.         Liquids should not contain sugar, caffeine, or carbonation! Acceptable Fluids Clear Liquids:   Water or Sugar-free flavored water, Fruit H2O   Decaffeinated coffee or tea (sugar-free)   Crystal Lite, Wyler's Lite, Minute Maid Lite   Sugar-free Jell-O   Bouillon or broth   Sugar-free Popsicle:   *Less than 20 calories each; Limit 1 per day   Full Liquids:              Protein Shakes/Drinks + 2 choices  per day of other full liquids shown below.    Other full liquids must be: No more than 12 grams of Carbs per serving,  No more than 3 grams of Fat per serving   Strained low-fat cream soup   Non-Fat milk   Fat-free Lactaid Milk   Sugar-free yogurt (Dannon Lite & Fit) Vitamins and Minerals (Start 1 day after surgery unless otherwise directed)   2 Chewable Multivitamin / Multimineral Supplement (i.e. Centrum for Adults)   Chewable Calcium Citrate with Vitamin D-3. Take 1500 mg each day.           (Example: 3 Chewable Calcium Plus 600 with Vitamin D-3 can be found at [facility])  Vitamin B-12, 350 - 500 micrograms (oral tablet) each day   Do not mix multivitamins containing iron with calcium supplements; take 2 hours   apart   Do not substitute Tums (calcium carbonate) for your calcium   Menstruating women and those at risk for anemia may need extra iron. Talk with your doctor to see if you need additional iron.    If you need extra iron:  Total daily Iron recommendations (including Vitamins) = 50 - 100 mg Iron/day Do not stop taking or change any vitamins or minerals until you talk to your nutritionist or surgeon. Your nutritionist and / or physician must approve all vitamin and mineral supplements. Exercise For maximum success, begin exercising as soon as your doctor recommends. Make sure your physician approves any physical activity.   Depending on fitness level, begin with a simple walking program   Walk 5-15 minutes each day, 7 days per week.    Slowly increase until you are walking 30-45 minutes per day   Consider joining our BELT program. [phone] or email belt@[employer] .edu Things to remember:    You may have sexual relations when you feel comfortable. It is VERY important for female patients to use a reliable birth control method. Fertility often increases after surgery. Do not get pregnant for at least 18 months.   It is very important to keep all follow up appointments with your  surgeon, nutritionist, primary care physician, and behavioral health practitioner. After the first year, please follow up with your bariatric surgeon at least once a year in order to maintain best weight loss results.  [facility]: [phone] [facility]: [phone]   Free counseling is available for you and your family through collaboration between [facility] and [facility]. Please call [phone] and leave a message.    Consider purchasing a medical alert bracelet that says you had gastric bypass surgery.    [facility] has a free Bariatric Surgery Support Group that meets monthly, the 3rd Thursday, 6 pm, Classroom #1, [employer]. You may register online at [email or web address], but registration is not necessary. Select Classes and Support Groups, Bariatric Surgery, or Call [phone]   Do not return to work or drive until cleared by your surgeon   Use your CPAP when sleeping if applicable   Do not lift anything greater than ten pounds for at least two weeks  Talmadge Chad, RN Bariatric Nurse Coordinator

## 2011-09-09 NOTE — Progress Notes (Signed)
UR complete 

## 2011-09-09 NOTE — Progress Notes (Signed)
1 Day Post-Op  Subjective: C/o that pain meds took awhile to get last night. Walked in room last pm. C/o of gas pain. No flatus.   Objective: Vital signs in last 24 hours: Temp:  [97.9 F (36.6 C)-98.6 F (37 C)] 97.9 F (36.6 C) (06/18 0612) Pulse Rate:  [77-94] 85  (06/18 0612) Resp:  [8-18] 18  (06/18 0612) BP: (107-146)/(72-87) 113/79 mmHg (06/18 0612) SpO2:  [94 %-100 %] 99 % (06/18 0612) Weight:  [279 lb 9.6 oz (126.826 kg)] 279 lb 9.6 oz (126.826 kg) (06/17 1645) Last BM Date: 09/08/11  Intake/Output from previous day: 06/17 0701 - 06/18 0700 In: 4520.8 [I.V.:4520.8] Out: 230 [Urine:125; Blood:100] Intake/Output this shift:    Alert, Nad, resting in bed cta Reg Soft, obese, incision dressings c/d/i, hypoBS +scds  Lab Results:   Basename 09/09/11 0515 09/08/11 1605  WBC 10.8* --  HGB 9.7* 10.3*  HCT 31.0* 31.8*  PLT 259 --   BMET No results found for this basename: NA:2,K:2,CL:2,CO2:2,GLUCOSE:2,BUN:2,CREATININE:2,CALCIUM:2 in the last 72 hours PT/INR No results found for this basename: LABPROT:2,INR:2 in the last 72 hours ABG No results found for this basename: PHART:2,PCO2:2,PO2:2,HCO3:2 in the last 72 hours  Studies/Results: No results found.  Anti-infectives: Anti-infectives     Start     Dose/Rate Route Frequency Ordered Stop   09/08/11 0904   cefOXitin (MEFOXIN) 2 g in dextrose 5 % 50 mL IVPB  Status:  Discontinued        2 g 100 mL/hr over 30 Minutes Intravenous 60 min pre-op 09/08/11 0904 09/08/11 1636          Assessment/Plan: s/p Procedure(s) (LRB): LAPAROSCOPIC ROUX-EN-Y GASTRIC (N/A)  Looks good. HR and temp are normal.   Start SSI to monitor blood sugars Ambulate  Swallow this am, if swallow ok - will adv to POD 1 diet Hgb/hct stable - continue perioperative VTE prophylaxis with scd and lovenox pulm toilet Will give reflux med for heartburn  [name]. [name], MD, FACS General, Bariatric, & Minimally Invasive Surgery [facility], [place]   LOS: 1 day    Virginia Stewart 09/09/2011

## 2011-09-10 LAB — CBC
HCT: 30 % — ABNORMAL LOW (ref 36.0–46.0)
Hemoglobin: 9.2 g/dL — ABNORMAL LOW (ref 12.0–15.0)
MCV: 82.4 fL (ref 78.0–100.0)
RDW: 14.7 % (ref 11.5–15.5)
WBC: 7.8 10*3/uL (ref 4.0–10.5)

## 2011-09-10 LAB — GLUCOSE, CAPILLARY
Glucose-Capillary: 132 mg/dL — ABNORMAL HIGH (ref 70–99)
Glucose-Capillary: 126 mg/dL — ABNORMAL HIGH (ref 70–99)
Glucose-Capillary: 132 mg/dL — ABNORMAL HIGH (ref 70–99)

## 2011-09-10 LAB — DIFFERENTIAL
Eosinophils Relative: 1 % (ref 0–5)
Lymphocytes Relative: 25 % (ref 12–46)
Lymphs Abs: 1.9 10*3/uL (ref 0.7–4.0)
Monocytes Absolute: 0.6 10*3/uL (ref 0.1–1.0)
Monocytes Relative: 8 % (ref 3–12)
Neutro Abs: 5.2 10*3/uL (ref 1.7–7.7)

## 2011-09-10 MED ORDER — OXYCODONE-ACETAMINOPHEN 5-325 MG/5ML PO SOLN: 5.0000 mL | ORAL | Status: DC | PRN

## 2011-09-10 MED ORDER — BUPROPION HCL 100 MG PO TABS: 100.0000 mg | ORAL_TABLET | Freq: Three times a day (TID) | ORAL | Status: DC

## 2011-09-10 MED ORDER — PANTOPRAZOLE SODIUM 40 MG PO TBEC: 40.0000 mg | DELAYED_RELEASE_TABLET | Freq: Every day | ORAL | Status: DC

## 2011-09-10 NOTE — Progress Notes (Signed)
Pt alert and oriented; VSS; states nausea improved; denies vomiting; tolerating water and protein shake; burping but denies flatus; no BM; voiding without difficulty; ambulating in hallways without difficulty; using incentive spirometer; c/o some abdominal soreness with relief from prn meds; pt already has follow up appts with [facility] and [facility]; aware of support group and BELT program; discharge instructions reviewed and pt verbalized understanding of. Probable discharge today. Continue current plan of care. GASTRIC BYPASS/SLEEVE DISCHARGE INSTRUCTIONS  Drs. Fredrik Rigger, Hoxworth, Wilson, and [place] Call if you have any problems.   Call [phone] and ask for the surgeon on call.    If you need immediate assistance come to the ER at [facility]. Tell the ER personnel that you are a new post-op gastric bypass patient. Signs and symptoms to report:   Severe vomiting or nausea. If you cannot tolerate clear liquids for longer than 1 day, you need to call your surgeon.    Abdominal pain which does not get better after taking your pain medication   Fever greater than 101 F degree   Difficulty breathing   Chest pain    Redness, swelling, drainage, or foul odor at incision sites    If your incisions open or pull apart   Swelling or pain in calf (lower leg)   Diarrhea, frequent watery, uncontrolled bowel movements.   Constipation, (no bowel movements for 3 days) if this occurs, Take Milk of Magnesia, 2 tablespoons by mouth, 3 times a day for 2 days if needed.  Call your doctor if constipation continues. Stop taking Milk of Magnesia once you have had a bowel movement. You may also use Miralax according to the label instructions.   Anything you consider "abnormal for you".   Normal side effects after Surgery:   Unable to sleep at night or concentrate   Irritability   Being tearful (crying) or depressed   These are common complaints, possibly related to your anesthesia, stress of surgery and change in  lifestyle, that usually go away a few weeks after surgery.  If these feelings continue, call your medical doctor.  Wound Care You may have surgical glue, steri-strips, or staples over your incisions after surgery.  Surgical glue:  Looks like a clear film over your incisions and will wear off gradually. Steri-strips: Strips of tape over your incisions. You may notice a yellowish color on the skin underneath the steri-strips. This is a substance used to make the steri-strips stick better. Do not pull the steri-strips off - let them fall off.  Staples: Cherlynn Polo may be removed before you leave the hospital. If you go home with staples, call [facility] [phone]) for an appointment with your surgeon's nurse to have staples removed in 7 - 10 days. Showering: You may shower two days after your surgery unless otherwise instructed by your surgeon. Wash gently around wounds with warm soapy water, rinse well, and gently pat dry.  If you have a drain, you may need someone to hold this while you shower. Avoid tub baths until staples are removed and incisions are healed.    Medications   Medications should be liquid or crushed if larger than the size of a dime.  Extended release pills should not be crushed.   Depending on the size and number of medications you take, you may need to stagger/change the time you take your medications so that you do not over-fill your pouch.    Make sure you follow-up with your primary care physician to make medication  adjustments needed during rapid weight loss and life-style adjustment.   If you are diabetic, follow up with the doctor that prescribes your diabetes medication(s) within one week after surgery and check your blood sugar regularly.   Do not drive while taking narcotics!   Do not take acetaminophen (Tylenol) and Roxicet or Lortab Elixir at the same time since these pain medications contain acetaminophen.  Diet at home: (First 2 Weeks) You will see the  nutritionist two weeks after your surgery. She will advance your diet if you are tolerating liquids well. Once at home, if you have severe vomiting or nausea and cannot tolerate clear liquids lasting longer than 1 day, call your surgeon.  Begin high protein shake 2 ounces every 3 hours, 5 - 6 times per day.  Gradually increase the amount you drink as tolerated.  You may find it easier to slowly sip shakes throughout the day.  It is important to get your proteins in first.   Protein Shake   Drink at least 2 ounces of shake 5-6 times per day   Each serving of protein shakes should have a minimum of 15 grams of protein and no more than 5 grams of carbohydrate    Increase the amount of protein shake you drink as tolerated   Protein powder may be added to fluids such as non-fat milk or Lactaid milk (limit to 20 grams added protein powder per serving   The initial goal is to drink at least 8 ounces of protein shake/drink per day (or as directed by the nutritionist). Some examples of protein shakes are [employer], Dillard's, EAS Edge HP, and Unjury. Hydration   Gradually increase the amount of water and other liquids as tolerated (See Acceptable Fluids)   Gradually increase the amount of protein shake as tolerated     Sip fluids slowly and throughout the day   May use Sugar substitutes, use sparingly (limit to 6 - 8 packets per day). Your fluid goal is 64 ounces of fluid daily. It may take a few weeks to build up to this.         32 oz (or more) should be clear liquids and 32 oz (or more) should be full liquids.         Liquids should not contain sugar, caffeine, or carbonation! Acceptable Fluids Clear Liquids:   Water or Sugar-free flavored water, Fruit H2O   Decaffeinated coffee or tea (sugar-free)   Crystal Lite, Wyler's Lite, Minute Maid Lite   Sugar-free Jell-O   Bouillon or broth   Sugar-free Popsicle:   *Less than 20 calories each; Limit 1 per day   Full Liquids:               Protein Shakes/Drinks + 2 choices per day of other full liquids shown below.    Other full liquids must be: No more than 12 grams of Carbs per serving,  No more than 3 grams of Fat per serving   Strained low-fat cream soup   Non-Fat milk   Fat-free Lactaid Milk   Sugar-free yogurt (Dannon Lite & Fit) Vitamins and Minerals (Start 1 day after surgery unless otherwise directed)   2 Chewable Multivitamin / Multimineral Supplement (i.e. Centrum for Adults)   Chewable Calcium Citrate with Vitamin D-3. Take 1500 mg each day.           (Example: 3 Chewable Calcium Plus 600 with Vitamin D-3 can be found at [facility])  Vitamin B-12, 350 - 500 micrograms (oral tablet) each day   Do not mix multivitamins containing iron with calcium supplements; take 2 hours   apart   Do not substitute Tums (calcium carbonate) for your calcium   Menstruating women and those at risk for anemia may need extra iron. Talk with your doctor to see if you need additional iron.    If you need extra iron:  Total daily Iron recommendations (including Vitamins) = 50 - 100 mg Iron/day Do not stop taking or change any vitamins or minerals until you talk to your nutritionist or surgeon. Your nutritionist and / or physician must approve all vitamin and mineral supplements. Exercise For maximum success, begin exercising as soon as your doctor recommends. Make sure your physician approves any physical activity.   Depending on fitness level, begin with a simple walking program   Walk 5-15 minutes each day, 7 days per week.    Slowly increase until you are walking 30-45 minutes per day   Consider joining our BELT program. [phone] or email [email or web address] Things to remember:    You may have sexual relations when you feel comfortable. It is VERY important for female patients to use a reliable birth control method. Fertility often increases after surgery. Do not get pregnant for at least 18 months.   It is very important to keep all  follow up appointments with your surgeon, nutritionist, primary care physician, and behavioral health practitioner. After the first year, please follow up with your bariatric surgeon at least once a year in order to maintain best weight loss results.  [facility]: [phone] [facility]: [phone]   Free counseling is available for you and your family through collaboration between [facility] and [place]. Please call [phone] and leave a message.    Consider purchasing a medical alert bracelet that says you had gastric bypass surgery.    The [facility] has a free Bariatric Surgery Support Group that meets monthly, the 3rd Thursday, 6 pm, Classroom #1, [employer]. You may register online at [email or web address], but registration is not necessary. Select Classes and Support Groups, Bariatric Surgery, or Call [phone]   Do not return to work or drive until cleared by your surgeon   Use your CPAP when sleeping if applicable   Do not lift anything greater than ten pounds for at least two weeks  Talmadge Chad, RN Bariatric Nurse Coordinator

## 2011-09-10 NOTE — Discharge Instructions (Signed)
GASTRIC BYPASS/SLEEVE DISCHARGE INSTRUCTIONS  Drs. Fredrik Rigger, Hoxworth, Georgine Wiltse, and [place]  Call if you have any problems.  Call [phone] and ask for the surgeon on call.  If you need immediate assistance come to the ER at [facility]. Tell the ER personnel that you are a new post-op gastric bypass patient. Signs and symptoms to report:  Severe vomiting or nausea. If you cannot tolerate clear liquids for longer than 1 day, you need to call your surgeon.  Abdominal pain which does not get better after taking your pain medication  Fever greater than 101 F degree  Difficulty breathing  Chest pain  Redness, swelling, drainage, or foul odor at incision sites  If your incisions open or pull apart  Swelling or pain in calf (lower leg)  Diarrhea, frequent watery, uncontrolled bowel movements.  Constipation, (no bowel movements for 3 days) if this occurs, Take Milk of Magnesia, 2 tablespoons by mouth, 3 times a day for 2 days if needed. Call your doctor if constipation continues. Stop taking Milk of Magnesia once you have had a bowel movement. You may also use Miralax according to the label instructions.  Anything you consider "abnormal for you". Normal side effects after Surgery:  Unable to sleep at night or concentrate  Irritability  Being tearful (crying) or depressed These are common complaints, possibly related to your anesthesia, stress of surgery and change in lifestyle, that usually go away a few weeks after surgery. If these feelings continue, call your medical doctor.  Wound Care  You may have surgical glue, steri-strips, or staples over your incisions after surgery.  Surgical glue: Looks like a clear film over your incisions and will wear off gradually.  Steri-strips: Strips of tape over your incisions. You may notice a yellowish color on the skin underneath the steri-strips. This is a substance used to make the steri-strips stick better. Do not pull the steri-strips off - let  them fall off.  Staples: Cherlynn Polo may be removed before you leave the hospital. If you go home with staples, call [facility] [phone]) for an appointment with your surgeon's nurse to have staples removed in 7 - 10 days.  Showering: You may shower two days after your surgery unless otherwise instructed by your surgeon. Wash gently around wounds with warm soapy water, rinse well, and gently pat dry. If you have a drain, you may need someone to hold this while you shower. Avoid tub baths until staples are removed and incisions are healed.  Medications  Medications should be liquid or crushed if larger than the size of a dime. Extended release pills should not be crushed.  Depending on the size and number of medications you take, you may need to stagger/change the time you take your medications so that you do not over-fill your pouch.  Make sure you follow-up with your primary care physician to make medication adjustments needed during rapid weight loss and life-style adjustment.  If you are diabetic, follow up with the doctor that prescribes your diabetes medication(s) within one week after surgery and check your blood sugar regularly.  Do not drive while taking narcotics!  Do not take acetaminophen (Tylenol) and Roxicet or Lortab Elixir at the same time since these pain medications contain acetaminophen. Diet at home: (First 2 Weeks)  You will see the nutritionist two weeks after your surgery. She will advance your diet if you are tolerating liquids well.  Once at home, if you have severe vomiting or nausea and cannot tolerate  clear liquids lasting longer than 1 day, call your surgeon.  Begin high protein shake 2 ounces every 3 hours, 5 - 6 times per day. Gradually increase the amount you drink as tolerated. You may find it easier to slowly sip shakes throughout the day. It is important to get your proteins in first.  Protein Shake  Drink at least 2 ounces of shake 5-6 times per day   Each serving of protein shakes should have a minimum of 15 grams of protein and no more than 5 grams of carbohydrate  Increase the amount of protein shake you drink as tolerated  Protein powder may be added to fluids such as non-fat milk or Lactaid milk (limit to 20 grams added protein powder per serving  The initial goal is to drink at least 8 ounces of protein shake/drink per day (or as directed by the nutritionist). Some examples of protein shakes are [employer], Dillard's, EAS Edge HP, and Unjury. Hydration  Gradually increase the amount of water and other liquids as tolerated (See Acceptable Fluids)  Gradually increase the amount of protein shake as tolerated  Sip fluids slowly and throughout the day  May use Sugar substitutes, use sparingly (limit to 6 - 8 packets per day). Your fluid goal is 64 ounces of fluid daily. It may take a few weeks to build up to this.  32 oz (or more) should be clear liquids and 32 oz (or more) should be full liquids.  Liquids should not contain sugar, caffeine, or carbonation!  Acceptable Fluids  Clear Liquids:  Water or Sugar-free flavored water, Fruit H2O  Decaffeinated coffee or tea (sugar-free)  Crystal Lite, Wyler's Lite, Minute Maid Lite  Sugar-free Jell-O  Bouillon or broth  Sugar-free Popsicle: *Less than 20 calories each; Limit 1 per day Full Liquids:  Protein Shakes/Drinks + 2 choices per day of other full liquids shown below.  Other full liquids must be: No more than 12 grams of Carbs per serving,  No more than 3 grams of Fat per serving  Strained low-fat cream soup  Non-Fat milk  Fat-free Lactaid Milk  Sugar-free yogurt (Dannon Lite & Fit) Vitamins and Minerals (Start 1 day after surgery unless otherwise directed)  2 Chewable Multivitamin / Multimineral Supplement (i.e. Centrum for Adults)  Chewable Calcium Citrate with Vitamin D-3. Take 1500 mg each day. (Example: 3 Chewable Calcium Plus 600 with Vitamin D-3 can be found at  [facility])  Vitamin B-12, 350 - 500 micrograms (oral tablet) each day Do not mix multivitamins containing iron with calcium supplements; take 2 hours apart  Do not substitute Tums (calcium carbonate) for your calcium  Menstruating women and those at risk for anemia may need extra iron. Talk with your doctor to see if you need additional iron.  If you need extra iron:  Total daily Iron recommendations (including Vitamins) = 50 - 100 mg Iron/day  Do not stop taking or change any vitamins or minerals until you talk to your nutritionist or surgeon. Your nutritionist and / or physician must approve all vitamin and mineral supplements.  Exercise  For maximum success, begin exercising as soon as your doctor recommends. Make sure your physician approves any physical activity.  Depending on fitness level, begin with a simple walking program  Walk 5-15 minutes each day, 7 days per week.  Slowly increase until you are walking 30-45 minutes per day  Consider joining our BELT program. [phone] or email [email or web address] Things to remember:  You may have sexual relations  when you feel comfortable. It is VERY important for female patients to use a reliable birth control method. Fertility often increases after surgery. Do not get pregnant for at least 18 months. It is very important to keep all follow up appointments with your surgeon, nutritionist, primary care physician, and behavioral health practitioner. After the first year, please follow up with your bariatric surgeon at least once a year in order to maintain best weight loss results.  [facility]: [phone]  [facility]: [phone]  Free counseling is available for you and your family through collaboration between [facility] and [place]. Please call [phone] and leave a message.  Consider purchasing a medical alert bracelet that says you had gastric bypass surgery.  The [facility]  has a free Bariatric Surgery Support Group that meets monthly, the 3rd Thursday, 6 pm, Classroom #1, [employer]. You may register online at [email or web address], but registration is not necessary. Select Classes and Support Groups, Bariatric Surgery, or Call [phone]  Do not return to work or drive until cleared by your surgeon  Use your CPAP when sleeping if applicable  Do not lift anything greater than ten pounds for at least two week Check your blood sugar at least twice a day and keep a journal of your blood sugars. If your blood sugar is consistent greater than 200, call your doctor. If your blood sugar is >450, call your doctor immediately. If your blood sugar is less than 60, drink some juice    Dr Rivka Spring [address] [place], [place] [address] [phone]

## 2011-09-10 NOTE — Discharge Summary (Signed)
Physician Discharge Summary  Patient ID: Virginia Stewart MRN: [id number] DOB/AGE: [birth year] 41 y.o.  Admit date: 09/08/2011 Discharge date: 09/10/2011  Admission Diagnoses: Patient Active Problem List  Diagnosis  . HYPERLIPIDEMIA  . Obesity, Class III, BMI 40-49.9 (morbid obesity)  . Insulin dependent type 2 diabetes mellitus    Discharge Diagnoses:  Active Problems:  HYPERLIPIDEMIA  Obesity, Class III, BMI 40-49.9 (morbid obesity)  Insulin dependent type 2 diabetes mellitus   Discharged Condition: good  Hospital Course: The patient is a 41 year old morbidly obese African American female admitted to the hospital on June 17 and taken to the operating room for a laparoscopic Roux-en-Y gastric bypass. She was maintained on perioperative chemical DVT prophylaxis. Her postoperative course was unremarkable. On postoperative day one, her upper GI showed no evidence of anastomotic leak. She was started on postop day one diet which she tolerated. On the evening of postoperative day one, she was advanced to protein shakes which she tolerated. She remained afebrile with a normal heart rate throughout her hospitalization. Her hemoglobin was essentially stable. On day of discharge, she was ambulating, her vital signs were stable, she was tolerating her protein shakes. She was seen stable for discharge. She was placed on a sliding scale of insulin postoperatively because of her insulin-dependent diabetes mellitus. Her blood sugars were less than 200 for the past 24 hours prior to discharge.  Consults: None  Significant Diagnostic Studies: labs: hgb 9.2, hct 30 on day of discharge; blood sugars 126-186 for past 24 hrs and radiology: Postop UGI swallow - no evidence of leak or obstruction  Treatments: IV hydration, analgesia: acetaminophen, Dilaudid, Morphine and roxicet elixir and surgery: LAPAROSCOPIC ROUX-EN-Y GASTRIC BYPASS, EGD  Discharge Exam: Blood pressure 121/80, pulse 86, temperature 98.7  F (37.1 C), temperature source Oral, resp. rate 18, height 5\' 7"  (1.702 m), weight 279 lb 9.6 oz (126.826 kg), last menstrual period 08/13/2011, SpO2 98.00%. Alert, nad CTA Reg Obese, soft, incision c/d/i. Very mild TTP (expected) No edema  Disposition: 01-Home or Self Care  Discharge Orders    Future Appointments: Provider: Department: Dept Phone: Center:   09/23/2011 4:00 PM [facility] [phone] [facility]   10/03/2011 2:15 PM Atilano Ina, MD,FACS [facility] [phone] None     Future Orders Please Complete By Expires   Increase activity slowly      Discharge instructions      Comments:   See discharge instructions provided. I strongly recommend staying in the immediate area for the next 2 weeks. I do NOT recommend leaving the area     Medication List  As of 09/10/2011 12:57 PM   STOP taking these medications         acetaminophen 500 MG chewable tablet      B-12 250 MCG Tabs      buPROPion 300 MG 24 hr tablet      Chromium Picolinate 1000 MCG Tabs      Cinnamon 500 MG capsule      ibuprofen 200 MG tablet      insulin glargine 100 UNIT/ML injection      loratadine 10 MG tablet      pseudoephedrine 30 MG tablet         TAKE these medications         buPROPion 100 MG tablet   Commonly known as: WELLBUTRIN   Take 1 tablet (100 mg total) by mouth 3 (three) times daily.      citalopram 20 MG  tablet   Commonly known as: CELEXA   Take 20 mg by mouth daily.      Fluticasone-Salmeterol 250-50 MCG/DOSE Aepb   Commonly known as: ADVAIR   Inhale 1 puff into the lungs every 12 (twelve) hours.      oxyCODONE-acetaminophen 5-325 MG/5ML solution   Commonly known as: ROXICET   Take 5-10 mLs by mouth every 4 (four) hours as needed.      pantoprazole 40 MG tablet   Commonly known as: PROTONIX   Take 1 tablet (40 mg total) by mouth daily.           Follow-up Information    Follow up with Atilano Ina, MD,FACS on 10/03/2011.  (at 2:15 pm)    Contact information:   [employer] [address] [place] [address] [phone]       Follow up with Sissy Hoff, MD in 1 week.   Contact information:   [address] [place] [address] [phone]          Pt was instructed to check blood sugars twice a day and a keep a log. She was given call for instructions.   [name]. [name], MD, FACS General, Bariatric, & Minimally Invasive Surgery [facility], [place]   Signed: Atilano Ina 09/10/2011, 12:57 PM

## 2011-09-18 ENCOUNTER — Ambulatory Visit: Payer: BC Managed Care – PPO

## 2011-09-19 MED ORDER — DEXTROSE 5 % IV SOLN
1.0000 g | INTRAVENOUS | Status: DC | PRN
  Administered 2011-09-08: 2 g via INTRAVENOUS

## 2011-09-19 NOTE — Addendum Note (Signed)
Addendum  created 09/19/11 1829 by Valeda Malm, CRNA   Modules edited:Anesthesia Medication Administration

## 2011-09-23 ENCOUNTER — Encounter: Payer: BC Managed Care – PPO | Attending: General Surgery | Admitting: *Deleted

## 2011-09-23 ENCOUNTER — Encounter: Payer: Self-pay | Admitting: *Deleted

## 2011-09-23 VITALS — Ht 67.0 in | Wt 270.0 lb

## 2011-09-23 DIAGNOSIS — Z01818 Encounter for other preprocedural examination: Secondary | ICD-10-CM | POA: Insufficient documentation

## 2011-09-23 DIAGNOSIS — Z713 Dietary counseling and surveillance: Secondary | ICD-10-CM | POA: Insufficient documentation

## 2011-09-23 DIAGNOSIS — E66813 Obesity, class 3: Secondary | ICD-10-CM

## 2011-09-23 NOTE — Patient Instructions (Addendum)
Patient to follow Phase 3A-Soft, High Protein Diet and follow-up at [facility] in 6 weeks for 2 months post-op nutrition visit for diet advancement. 

## 2011-09-23 NOTE — Progress Notes (Addendum)
.   Bariatric Surgery Follow-Up:  Appt start time: 1600 end time:  1700.  2 Week Post-Operative Nutrition Visit  Patient was seen on 09/23/2011 for Post-Operative [facility]   Surgery date: 09/08/11 Surgery type: RYGB Start weight @ [facility]: 284.7 lbs  Weight today: 270.0 lbs Weight change: 14.7 lbs Total weight lost: 14.7 lbs BMI: 42.3 kg/m^2  Goal weight: 160 lbs % goal met: 12%  TANITA  BODY COMP RESULTS  09/23/11   %Fat 51.3%   Fat Mass (lbs) 138.5   Fat Free Mass (lbs) 131.5   Total Body Water (lbs) 96.5   The following the learning objective met the patient during this course:  Identifies Phase 3A (Soft, High Proteins) Dietary Goals and will begin from 2 weeks post-operatively to 2 months post-operatively  Identifies appropriate sources of fluids and proteins   States protein recommendations and appropriate sources post-operatively  Identifies the need for appropriate texture modifications, mastication, and bite sizes when consuming solids  Identifies appropriate multivitamin and calcium sources post-operatively  Describes the need for physical activity post-operatively and will follow MD recommendations  States when to call healthcare provider regarding medication questions or post-operative complications  Handouts given during class include:  Phase 3A: Soft, High Protein Diet Handout  Follow-Up Plan: Patient will follow-up at [facility] in 6 weeks for 2 months post-op nutrition visit for diet advancement per MD.

## 2011-10-03 ENCOUNTER — Encounter (INDEPENDENT_AMBULATORY_CARE_PROVIDER_SITE_OTHER): Payer: Self-pay | Admitting: General Surgery

## 2011-10-03 ENCOUNTER — Ambulatory Visit (INDEPENDENT_AMBULATORY_CARE_PROVIDER_SITE_OTHER): Payer: Self-pay | Admitting: General Surgery

## 2011-10-03 ENCOUNTER — Ambulatory Visit (INDEPENDENT_AMBULATORY_CARE_PROVIDER_SITE_OTHER): Payer: BC Managed Care – PPO | Admitting: General Surgery

## 2011-10-03 VITALS — BP 134/88 | HR 76 | Temp 97.2°F | Resp 18 | Ht 67.0 in | Wt 265.6 lb

## 2011-10-03 DIAGNOSIS — Z9884 Bariatric surgery status: Secondary | ICD-10-CM

## 2011-10-03 NOTE — Patient Instructions (Signed)
Keep up the good work.  Keep exercising. Try incorporating intervals into your exercise

## 2011-10-03 NOTE — Progress Notes (Signed)
Subjective:     Patient ID: Virginia Stewart, female   DOB: December 21, 1970, 41 y.o.   MRN: [id number]  HPI 41 year old morbidly obese African American female comes in today for her first postoperative appointment after undergoing laparoscopic Roux-en-Y gastric bypass on June 17. She was discharged from the hospital on June 19. She states that she has been doing well. She is now on solids. She denies any abdominal pain. She occasionally has some soreness in her epigastric area. She denies any fevers or chills. She denies any recent regurgitation. She had an episode of regurgitation when she was advanced to solids the first day or two. She reports daily bowel movements. She denies any melena or hematochezia. She reports increased energy level. 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 She is walking for approximately 45 minutes at a time. She has followed up with her primary care physician who placed her back on Lantus; however, she is on the lower dosage. She has been titrating down her nightly dosage. Preoperatively she was taking 90 units of Lantus at night. She is currently taking 40 units at night. She states her blood sugars run from 145-175. She states that she has been referred to Dr. Sharl Ma for management of her blood sugars for the next 6 months. She states that she has been taking her supplements  Review of Systems     Objective:   Physical Exam BP 134/88  Pulse 76  Temp 97.2 F (36.2 C) (Temporal)  Resp 18  Ht 5\' 7"  (1.702 m)  Wt 265 lb 9.6 oz (120.475 kg)  BMI 41.60 kg/m2  Gen: alert, NAD, non-toxic appearing Pupils: equal, no scleral icterus Pulm: Lungs clear to auscultation, symmetric chest rise CV: regular rate and rhythm Abd: soft, nontender, nondistended. Well-healed trocar sites. No cellulitis. No incisional hernia Ext: no edema, no calf tenderness Skin: no rash, no jaundice     Assessment:     41 year old female status post laparoscopic Roux-en-Y gastric bypass    Plan:     I believe she is doing very well. I congratulated on her weight loss. She has lost 22 pounds since surgery.  She was encouraged to continue with her exercising. We did discuss incorporating interval training into her program. We also discussed the BELT program. She would like to be referred. I encouraged her to continue to take her daily supplements. Followup 3 months  [name]. [name], MD, FACS General, Bariatric, & Minimally Invasive Surgery [facility], [place]

## 2011-10-23 ENCOUNTER — Ambulatory Visit (INDEPENDENT_AMBULATORY_CARE_PROVIDER_SITE_OTHER): Payer: Self-pay | Admitting: General Surgery

## 2011-11-05 ENCOUNTER — Encounter: Payer: Self-pay | Admitting: *Deleted

## 2011-11-05 ENCOUNTER — Ambulatory Visit: Payer: BC Managed Care – PPO | Admitting: *Deleted

## 2011-11-05 ENCOUNTER — Encounter: Payer: BC Managed Care – PPO | Attending: General Surgery | Admitting: *Deleted

## 2011-11-05 VITALS — Ht 67.0 in | Wt 255.0 lb

## 2011-11-05 DIAGNOSIS — Z713 Dietary counseling and surveillance: Secondary | ICD-10-CM | POA: Insufficient documentation

## 2011-11-05 DIAGNOSIS — Z01818 Encounter for other preprocedural examination: Secondary | ICD-10-CM | POA: Insufficient documentation

## 2011-11-05 NOTE — Patient Instructions (Addendum)
Goals:  Follow Phase 3B: High Protein + Non-Starchy Vegetables  Eat 3-6 small meals/snacks, every 3-5 hrs  Continue intake of lean protein foods to meet 60-80g goal  Continue intake fluid intake of 64oz +  Avoid drinking 15 minutes before, during and 30 minutes after eating  Continue >30 min of physical activity daily

## 2011-11-05 NOTE — Progress Notes (Signed)
Follow-up visit:  8 Weeks Post-Operative RYGB Surgery  Medical Nutrition Therapy:  Appt start time: 1140  End time:  1225.  Primary concerns today: Post-operative Bariatric Surgery Nutrition Management. Virginia Stewart returns today for 2 mo f/u and reports doing well.  Her only issue is recurrent episodes of hypoglycemia when taking insulin regularly, so she has backed off to taking approximately once a week. Advised her to contact endocrinologist.   Surgery date: 09/08/11 Surgery type: RYGB Start weight @ [facility]: 284.7 lbs  Weight today: 255.0 lbs Weight change: 15.0 lbs Total weight lost: 29.7 lbs BMI: 39.9 kg/m^2  Goal weight: 160 lbs % goal met: 24%  TANITA  BODY COMP RESULTS  09/23/11 11/05/11   %Fat 51.3% 49.2%   Fat Mass (lbs) 138.5 125.5   Fat Free Mass (lbs) 131.5 129.5   Total Body Water (lbs) 96.5 95.0   24-hr recall: B (AM): Protein shake (EAS) or sometimes scrambled egg w/ 1 pc [place] bacon Snk (AM):  Light n Fit yogurt (4g pro, 80 cal) - strawberry or peach L (PM): Green beans, zucchini, squash & onions (for flavor only), grilled Salmon/Tilapia (2-4 oz) or 2 pcs deli [place] Snk (PM): Protein shake  D (PM): Ground [place] with small amt tomato sauce OR pc of chicken breast Snk (PM): None  Fluid intake: 64+ oz (water, powerade zero) Estimated total protein intake:  80-90 g  Medications: Off Januvia now; reports taking insulin only ~1 time/week d/t recurrent episodes of hypoglycemia Supplementation: Taking regularly  CBG monitoring: Daily Average Fasting CBG per patient: 76-154 mg/dL (discussed having small snack before bed) Last patient reported A1c: 2 weeks after surgery w/ Dr. Herbert Moors - approx. 8.1% (per pt)  Using straws: No Drinking while eating: No Hair loss: No Carbonated beverages: No N/V/D/C: None Dumping syndrome: No  Recent physical activity:  4-5 days week at gym  Progress Towards Goal(s):  In progress.  Handouts given during visit include:  Phase 3B:  High Protein + Vegetables   Nutritional Diagnosis:  Schleicher-3.3 Overweight/obesity related to recent RYGB surgery as evidenced by patient following post-op nutrition guidelines for continued weight loss.    Intervention:  Nutrition education.  Monitoring/Evaluation:  Dietary intake, exercise, and body weight. Follow up in 1 month for 3 month post-op visit.

## 2011-11-20 ENCOUNTER — Encounter (INDEPENDENT_AMBULATORY_CARE_PROVIDER_SITE_OTHER): Payer: BC Managed Care – PPO | Admitting: General Surgery

## 2011-12-03 ENCOUNTER — Ambulatory Visit: Payer: BC Managed Care – PPO | Admitting: *Deleted

## 2011-12-08 ENCOUNTER — Encounter: Payer: BC Managed Care – PPO | Attending: General Surgery | Admitting: *Deleted

## 2011-12-08 ENCOUNTER — Encounter: Payer: Self-pay | Admitting: *Deleted

## 2011-12-08 VITALS — Ht 67.0 in | Wt 243.5 lb

## 2011-12-08 DIAGNOSIS — Z713 Dietary counseling and surveillance: Secondary | ICD-10-CM | POA: Insufficient documentation

## 2011-12-08 DIAGNOSIS — Z01818 Encounter for other preprocedural examination: Secondary | ICD-10-CM | POA: Insufficient documentation

## 2011-12-08 NOTE — Patient Instructions (Addendum)
Goals:  Follow Phase 3B: High Protein + Non-Starchy Vegetables  Eat 3-6 small meals/snacks, every 3-5 hrs  Continue intake of lean protein foods to meet 60-80g goal  Continue intake fluid intake of 64oz +  Avoid drinking 15 minutes before, during and 30 minutes after eating  Continue >30 min of physical activity daily  Take iron supplement with a source of vitamin C for increased absorption.   Alert MD you are no longer taking your insulin!! :)

## 2011-12-08 NOTE — Progress Notes (Signed)
Follow-up visit:  12 Weeks Post-Operative RYGB Surgery  Medical Nutrition Therapy:  Appt start time: 415  End time:  445.  Primary concerns today: Post-operative Bariatric Surgery Nutrition Management. Virginia Stewart returns today for 3 mo f/u additional 11.5 lb weight loss (& 15 lbs of fat mass).  No longer taking insulin, though has not told MD; advised her to alert MD. Unable to tolerate thicker fish, but eating tilapia. Some vomiting also noted with sample of chicken from food court.  Admits recently cheated with a 6 oz piece (over 1 hr) of "honeybun cake". No dumping reported with this. 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 Discussed resuming.   Surgery date: 09/08/11 Surgery type: RYGB Start weight @ [facility]: 284.7 lbs  Weight today: 243.5 lbs Weight change: 11.5 lbs Total weight lost: 41.2 lbs BMI: 38.1 kg/m^2  Goal weight: 160 lbs % goal met: 33%  TANITA  BODY COMP RESULTS  09/23/11 11/05/11 12/08/11   %Fat 51.3% 49.2% 45.4%   Fat Mass (lbs) 138.5 125.5 110.5   Fat Free Mass (lbs) 131.5 129.5 133.0   Total Body Water (lbs) 96.5 95.0 97.5   24-hr recall: B (AM): EAS Protein shake Snk (AM):  Protein bar L (PM): Tuna salad w/ veggies ([employer]) Snk (PM):  Light n Fit yogurt (4g pro, 80 cal) - strawberry or peach OR Protein bar D (PM): Homemade BBQ w/ NAS bbq sauce (in crockpot) - "too stringy" Snk (PM): None  Fluid intake: 64+ oz (water, powerade zero, protein shake) Estimated total protein intake:  80-90 g  Medications: Off insulin now. Has not told endocrinologist. Advised to alert him asap Supplementation: Taking regularly; added iron (not sure of dosage - advised to take with vit c) and B complex (tablet - advised to switch to sublingual)  CBG monitoring: ~ 3 times a week Average Fasting CBG per patient: 75 (fbg) -141 (after meal) mg/dL (discussed having small snack before bed) Last patient reported A1c: No  new A1c.  2 weeks after surgery w/ Dr. Herbert Moors - approx. 8.1%   Using straws: No Drinking while eating: No Hair loss: No Carbonated beverages: No N/V/D/C: None Dumping syndrome: No  Recent physical activity:  None for the past 2 weeks.   Progress Towards Goal(s):  In progress.   Nutritional Diagnosis:  Chandler-3.3 Overweight/obesity related to recent RYGB surgery as evidenced by patient following post-op nutrition guidelines for continued weight loss.    Intervention:  Nutrition education.  Monitoring/Evaluation:  Dietary intake, exercise, and body weight. Follow up in 3 month for 6 month post-op visit.

## 2012-01-02 ENCOUNTER — Telehealth (INDEPENDENT_AMBULATORY_CARE_PROVIDER_SITE_OTHER): Payer: Self-pay

## 2012-01-02 NOTE — Telephone Encounter (Signed)
Pt called stating she has been feeling tired and a bit lethargic the past few days. Pt states she has not been drinking as much fluid as she should. Pt states she is eating well and does 2 protein shakes daily. I advised her to increase her fluid intake to at least 5-8 glases of water daily. Pt states she checks her blood sugars regularly and they do drop to around 80 at times. Pt advised to keep track of blood sugar levels and if they are staying down she needs to address this at her visit next week with Dr [name]. Pt states she knows how to treat if her blood sugar stays low. Pt will call if any concerns.

## 2012-01-16 ENCOUNTER — Ambulatory Visit (INDEPENDENT_AMBULATORY_CARE_PROVIDER_SITE_OTHER): Payer: BC Managed Care – PPO | Admitting: General Surgery

## 2012-01-20 ENCOUNTER — Telehealth (INDEPENDENT_AMBULATORY_CARE_PROVIDER_SITE_OTHER): Payer: Self-pay | Admitting: General Surgery

## 2012-01-20 NOTE — Telephone Encounter (Signed)
Patient missed follow up on 01/16/2012 for her gastric bypass. Called patient and r/s appt for 02/26/2012.

## 2012-01-27 ENCOUNTER — Ambulatory Visit: Payer: Self-pay | Admitting: Obstetrics and Gynecology

## 2012-02-26 ENCOUNTER — Encounter (INDEPENDENT_AMBULATORY_CARE_PROVIDER_SITE_OTHER): Payer: Self-pay | Admitting: General Surgery

## 2012-02-26 ENCOUNTER — Ambulatory Visit (INDEPENDENT_AMBULATORY_CARE_PROVIDER_SITE_OTHER): Payer: BC Managed Care – PPO | Admitting: General Surgery

## 2012-02-26 VITALS — BP 100/70 | HR 76 | Resp 14 | Ht 67.0 in | Wt 242.0 lb

## 2012-02-26 DIAGNOSIS — Z9884 Bariatric surgery status: Secondary | ICD-10-CM

## 2012-02-26 NOTE — Progress Notes (Signed)
Subjective:     Patient ID: Virginia Stewart, female   DOB: May 02, 1970, 41 y.o.   MRN: [id number]  HPI 41 year old [place] American female comes in for 6 month followup after laparoscopic Roux-en-Y gastric bypass surgery on June 17. I 41 year old [place] At her last visit she weighed 265 pounds. She missed her 51month appt. She states that she has done well. She states that she's been experimenting over the past several months with foods. She states she was able to eat some carrot cake without any dumping. She states she has gained about 8 pounds over the holidays. She states she has not been exercising. She reports daily bowel movements. She denies any fever, chills, nausea, vomiting, regurgitation. She states that she has gone from a dress size of 22 and now to 14. She states that she has been weaned off her insulin by her physician. She states that she has been off her insulin for about a month. She states that her blood sugars range from the high 60s to low 100s. She is eating about 64 g of protein a day. She is complaining of some hair thinning and hair loss. She recently started taking biotin about a month ago. She is not having significant difficulties with food intake.  PMHx, PSHx, SOCHx, FAMHx, ALL reviewed and unchanged   Review of Systems 10 point ROS negative except for above    Objective:   Physical Exam BP 100/70  Pulse 76  Resp 14  Ht 5\' 7"  (1.702 m)  Wt 242 lb (109.77 kg)  BMI 37.90 kg/m2  Gen: alert, NAD, non-toxic appearing Pupils: equal, no scleral icterus Pulm: Lungs clear to auscultation, symmetric chest rise CV: regular rate and rhythm Abd: soft, nontender, nondistended. Well-healed trocar sites. No cellulitis. No incisional hernia Ext: no edema, no calf tenderness Skin: no rash, no jaundice     Assessment:     Obesity BMI 37.9 - total wt loss to date - 43 pounds IDDM - resolved, no longer on insulin, Hgb A1C pending    Plan:     To date her weight loss  has been 43 pounds. I am very pleased that she has been able to be weaned off her insulin. However I am concerned with her lack of exercise and her experimentation with sweets.  I highly encouraged the patient to get back on her regular exercise regimen. We discussed setting short-term goals. I explained that it is crucial that she get some form of exercise for her long-term health. I also encouraged her to avoid sweets. She has been compliant with her supplements. With respect to her hair thinning, it sounds like she is getting adequate protein. I encouraged her to continue to take the biotin. We will check surveillance labs and look for any deficiencies. Followup 3 months  [name]. [name], MD, FACS General, Bariatric, & Minimally Invasive Surgery [facility], [place]

## 2012-02-26 NOTE — Patient Instructions (Addendum)
Get your labs drawn You need to start exercising regularly

## 2012-03-08 ENCOUNTER — Encounter: Payer: Self-pay | Admitting: *Deleted

## 2012-03-08 ENCOUNTER — Encounter: Payer: BC Managed Care – PPO | Attending: General Surgery | Admitting: *Deleted

## 2012-03-08 VITALS — Ht 67.0 in | Wt 237.5 lb

## 2012-03-08 DIAGNOSIS — Z09 Encounter for follow-up examination after completed treatment for conditions other than malignant neoplasm: Secondary | ICD-10-CM | POA: Insufficient documentation

## 2012-03-08 DIAGNOSIS — Z713 Dietary counseling and surveillance: Secondary | ICD-10-CM | POA: Insufficient documentation

## 2012-03-08 DIAGNOSIS — Z9884 Bariatric surgery status: Secondary | ICD-10-CM | POA: Insufficient documentation

## 2012-03-08 NOTE — Patient Instructions (Addendum)
Goals:  Follow Phase 3B: High Protein + Non-Starchy Vegetables  AVOID fast food and chocolate covered cherries!!  Resume >30 min of physical activity daily

## 2012-03-08 NOTE — Progress Notes (Signed)
Follow-up visit:  6 Months Post-Operative RYGB Surgery  Medical Nutrition Therapy:  Appt start time:  445  End time:  530.  Primary concerns today: Post-operative Bariatric Surgery Nutrition Management. Alithia returns today for 6 mo f/u additional 6.0 lb weight loss. Reports that she does not dump with sweets and has been eating more sweets.  Is starving between meals.  Reports drinking sweet tea and eating cherries with juice (~10 daily). Also eating sweet chili chicken wraps from [employer], though does not eat the wrap. Not exercising at this time.  Surgery date: 09/08/11 Surgery type: RYGB Start weight @ [facility]: 284.7 lbs  Weight today: 237.5 lbs Weight change: 6.0 lbs Total weight lost: 47.2 lbs BMI: 37.2 kg/m^2  Goal weight: 160 lbs % goal met: 38%  TANITA  BODY COMP RESULTS  09/23/11 11/05/11 12/08/11 03/08/12   Fat Mass (lbs) 138.5 125.5 110.5 106.5   Fat Free Mass (lbs) 131.5 129.5 133.0 131.0   Total Body Water (lbs) 96.5 95.0 97.5 96.0   24-hr recall: B (7:15-7:30AM): EAS Protein shake OR 2 pc [place] bacon, hard boiled egg Snk (9:30 AM):  EAS Protein Shake Snk (11 AM): Pure Protein bars (20g) L (12:30 PM): 8 grilled chicken cubes w/ rustic herb seasoning (sodium free), normally has non-starchy veggie too  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 6.3%  (11/2011); order in for labs which she will do soon  Using straws: No Drinking while eating: No Hair loss: Yes; started  about 1 mo ago. Taking 10,000 mg biotin Carbonated beverages: No N/V/D/C: None; but feels bloated when eating 3 servings of animal protein in same day Dumping syndrome: No  Recent physical activity:  None   Progress Towards Goal(s):  In progress.   Nutritional Diagnosis:  Kinsman-3.3 Overweight/obesity related to recent RYGB surgery as evidenced by patient following post-op nutrition guidelines for continued weight loss.    Intervention:  Nutrition education.  Monitoring/Evaluation:  Dietary intake, exercise, and body weight. Follow up in 3 month for 9 month post-op visit.

## 2012-03-16 LAB — CBC WITH DIFFERENTIAL/PLATELET
Basophils Absolute: 0 10*3/uL (ref 0.0–0.1)
Lymphocytes Relative: 33 % (ref 12–46)
Neutro Abs: 3.4 10*3/uL (ref 1.7–7.7)
Neutrophils Relative %: 59 % (ref 43–77)
Platelets: 287 10*3/uL (ref 150–400)
RDW: 14.8 % (ref 11.5–15.5)
WBC: 5.8 10*3/uL (ref 4.0–10.5)

## 2012-03-16 LAB — LIPID PANEL
Cholesterol: 131 mg/dL (ref 0–200)
HDL: 48 mg/dL (ref >39)
Total CHOL/HDL Ratio: 2.7 Ratio
VLDL: 10 mg/dL (ref 0–40)

## 2012-03-16 LAB — COMPREHENSIVE METABOLIC PANEL
ALT: 13 U/L (ref 0–35)
AST: 16 U/L (ref 0–37)
Albumin: 4.4 g/dL (ref 3.5–5.2)
Calcium: 9.8 mg/dL (ref 8.4–10.5)
Chloride: 101 mEq/L (ref 96–112)
Potassium: 4.2 mEq/L (ref 3.5–5.3)

## 2012-03-16 LAB — IBC PANEL: TIBC: 399 ug/dL (ref 250–470)

## 2012-03-16 LAB — T4: T4, Total: 8.6 ug/dL (ref 5.0–12.5)

## 2012-03-16 LAB — VITAMIN B12: Vitamin B-12: >2000 pg/mL — ABNORMAL HIGH (ref 211–911)

## 2012-03-21 LAB — ZINC: Zinc: 63 ug/dL (ref 60–130)

## 2012-03-21 LAB — VITAMIN A: Vitamin A (Retinoic Acid): 22 ug/dL — ABNORMAL LOW (ref 38–98)

## 2012-03-22 LAB — VITAMIN B1: Vitamin B1 (Thiamine): 12 nmol/L (ref 8–30)

## 2012-05-03 ENCOUNTER — Encounter (INDEPENDENT_AMBULATORY_CARE_PROVIDER_SITE_OTHER): Payer: Self-pay | Admitting: General Surgery

## 2012-06-07 ENCOUNTER — Ambulatory Visit: Payer: BC Managed Care – PPO | Admitting: *Deleted

## 2012-06-09 ENCOUNTER — Ambulatory Visit: Payer: BC Managed Care – PPO | Admitting: *Deleted

## 2012-06-23 ENCOUNTER — Telehealth (INDEPENDENT_AMBULATORY_CARE_PROVIDER_SITE_OTHER): Payer: Self-pay | Admitting: *Deleted

## 2012-06-23 NOTE — Telephone Encounter (Signed)
Patient called to state that she is having some abdominal pain and some episodes of being lightheaded.  Appt with [name] MD moved from next week to tomorrow.  Patient hung up prior to being given new appt date and time.  Patient's voicemail box full at this time but will try to call her back.

## 2012-06-23 NOTE — Telephone Encounter (Signed)
Patient called back and was given new appt time and date.

## 2012-06-24 ENCOUNTER — Encounter (INDEPENDENT_AMBULATORY_CARE_PROVIDER_SITE_OTHER): Payer: Self-pay | Admitting: General Surgery

## 2012-06-24 ENCOUNTER — Ambulatory Visit (INDEPENDENT_AMBULATORY_CARE_PROVIDER_SITE_OTHER): Payer: BC Managed Care – PPO | Admitting: General Surgery

## 2012-06-24 DIAGNOSIS — K912 Postsurgical malabsorption, not elsewhere classified: Secondary | ICD-10-CM

## 2012-06-24 DIAGNOSIS — Z9884 Bariatric surgery status: Secondary | ICD-10-CM

## 2012-06-24 DIAGNOSIS — Z09 Encounter for follow-up examination after completed treatment for conditions other than malignant neoplasm: Secondary | ICD-10-CM

## 2012-06-24 DIAGNOSIS — E669 Obesity, unspecified: Secondary | ICD-10-CM | POA: Insufficient documentation

## 2012-06-24 NOTE — Progress Notes (Signed)
Subjective:     Patient ID: Benita Stabile, female   DOB: 09/02/1970, 42 y.o.   MRN: [id number]  HPI 42 year old obese African American female comes in for long-term followup after and going laparoscopic Roux-en-Y gastric bypass surgery in June 2013. I last saw the patient on 02/26/2012. At that time she was struggling with making appropriate food choices as well as not exercising on a regular basis. She comes in today complaining of some lightheadedness mainly symptoms of room spinning on occasion. She also states that she has fatigue and low energy. She denies any nausea, vomiting, regurgitation, or burn, diarrhea or constipation. She denies any paresthesias. She states that she has not been exercising. She states that she has not been making good food choices. She states that she has been going to a fast food drive thru. She states that she tries to make good food choices at the [employer]. She was using a pedometer but is no longer using it. She states that she is taking her multivitamins and supplements as instructed. She states that she is also able to larger  Portion sizes. Her March nutrition appointment was canceled I think because of inclement weather but she was a no-show for the rescheduled appointment. She states that she has a lot of stress at work. She is also raising her daughter by herself. 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  PMHx, PSHx, SOCHx, FAMHx, ALL reviewed  Labs from December 2013 show a normal comprehensive metabolic panel with the exception of a blood glucose level of 116, hemoglobin level was 11.7 and hematocrit was 34.7 which are both improved from prior checks. Her hemoglobin A1c had improved from 8.8 down to 6.4. Her LDL level had normalized from 113 down to 73. Her vitamin B 12 level was greater than 2000  Review of Systems 12 point ROS performed and All systems are negative except for what  is mentioned in the history of present illness    Objective:   Physical Exam BP 118/64  Pulse 69  Temp(Src) 97.3 F (36.3 C) (Temporal)  Resp 16  Ht 5\' 7"  (1.702 m)  Wt 232 lb 6.4 oz (105.416 kg)  BMI 36.39 kg/m2  SpO2 98%  Gen: alert, NAD, non-toxic appearing Pupils: equal, no scleral icterus Pulm: Lungs clear to auscultation, symmetric chest rise CV: regular rate and rhythm Abd: soft, nontender, nondistended. Well-healed trocar sites. No cellulitis. 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 She has only lost about 10 pounds since her last visit in December. Total weight loss is around 52.6 pounds. I explained to her that her weight loss is probably plateaued because of her food choices as well as her lack of exercise. We discussed strategies To try to increase her exercise. We discussed wearing a pedometer. We discussed her meeting with her friend who is a Pharmacist, community at the gym for a cardio session, albeit been doing different cardio regimens. We discussed her needing to improve her sleep pattern. Right now she is averaging 4-6 hours per night. I explained her that ideally she needs to be getting around minimal 6 preferably 7 hours of sleep. I explained how lactose we can  contribute to fatigue as well as weight gain. I encouraged her to be compliant with her supplements. She needs a refresher with Maralyn Sago. We also made her an appointment to see the psychologist Dr. Cyndia Skeeters as well. She was scheduled to see the nutritionist. We will also check Electrolyte panel and CBC just to make sure there are no electrolyte abnormalities.  Followup up 2 months  [name]. [name], MD, FACS General, Bariatric, & Minimally Invasive Surgery [facility], [place]

## 2012-06-24 NOTE — Patient Instructions (Addendum)
Make appt with Huntley Dec  Work on exercising - try to start with walking several times a week and changing the pace, wear your pedometer and keep track of your steps and make goals to increase your daily step count. Talk with your bodybuilder friend and make a commitment to meet her at the gym for a cardio session

## 2012-06-30 ENCOUNTER — Ambulatory Visit (INDEPENDENT_AMBULATORY_CARE_PROVIDER_SITE_OTHER): Payer: BC Managed Care – PPO | Admitting: General Surgery

## 2012-07-01 ENCOUNTER — Ambulatory Visit (INDEPENDENT_AMBULATORY_CARE_PROVIDER_SITE_OTHER): Payer: BC Managed Care – PPO | Admitting: General Surgery

## 2012-07-22 ENCOUNTER — Ambulatory Visit: Payer: BC Managed Care – PPO | Admitting: *Deleted

## 2012-08-26 ENCOUNTER — Encounter (INDEPENDENT_AMBULATORY_CARE_PROVIDER_SITE_OTHER): Payer: Self-pay | Admitting: General Surgery

## 2012-08-26 ENCOUNTER — Ambulatory Visit (INDEPENDENT_AMBULATORY_CARE_PROVIDER_SITE_OTHER): Payer: BC Managed Care – PPO | Admitting: General Surgery

## 2012-08-26 VITALS — BP 118/88 | HR 92 | Temp 97.4°F | Resp 18 | Ht 67.0 in | Wt 234.6 lb

## 2012-08-26 DIAGNOSIS — Z9884 Bariatric surgery status: Secondary | ICD-10-CM

## 2012-08-26 NOTE — Patient Instructions (Signed)
Get your lab work done  What happen in the past is done. Lets focus on moving forward Continue to work on good food choices Use your pedometer

## 2012-08-26 NOTE — Progress Notes (Signed)
Subjective:     Patient ID: Virginia Stewart, female   DOB: [birth year], 42 y.o.   MRN: [id number]  HPI 42 year old [place] American female comes in for short interval followup after her last appointment in April. She is essentially one year out from laparoscopic Roux-en-Y gastric bypass surgery. I last saw her in the office on April 3. At that time she weighed 232 pounds. She was also struggling with food choices, lack of motivation, and decreased exercise. She ended up seeing Dr. Cyndia Stewart and diagnosed with recurrent depresion. She was started on Wellbutrin. She states that she is more motivated now and feels better. She has still been struggling with proper food choices. She ate a piece of cake. She is also been drinking some wine on occasion. She denies any significant abdominal pain, nausea, vomiting, regurgitation. She denies any hair loss or paresthesias. Her fatigue and weakness has improved since she started exercising again. She is doing the elliptical once or twice a week. She is doing Colbert Ewing method DVDs daily except for Saturday. She is not using her pedometer currently. She states that she is taking her supplements and her multivitamin as directed PMHx, PSHx, SOCHx, FAMHx, ALL reviewed   Review of Systems 10 point review of systems is negative except for what is mentioned in the history of present illness    Objective:   Physical Exam BP 118/88  Pulse 92  Temp(Src) 97.4 F (36.3 C) (Temporal)  Resp 18  Ht 5\' 7"  (1.702 m)  Wt 234 lb 9.6 oz (106.414 kg)  BMI 36.73 kg/m2  Gen: alert, NAD, non-toxic appearing Pupils: equal, no scleral icterus Pulm: Lungs clear to auscultation, symmetric chest rise CV: regular rate and rhythm Abd: soft, nontender, nondistended. Well-healed trocar sites. No cellulitis. No incisional hernia Skin: no rash, no jaundice     Assessment:     Status post laparoscopic Roux-en-Y gastric bypass Dyslipidemia-improved Insulin-dependent diabetes  mellitus-improved Joint pain-improved     Plan:     She is due for surveillance labs. She will be scheduled for her yearly lab work soon. She missed her followup appointment with the nutritionist. She states that she just did not have the will power to see the nutritionist. She was disappointed in herself and did not want to let down the nutritionist. She has e-mailed her however. We discussed the importance of proper food choices as well as routine exercise. I congratulated her on keeping her appointment with the psychologist and addressing her issues with depression. She states that she is sleeping 7 hours on a more frequent basis. We discussed using her pedometer. She was given some calcium supplement samples. Because she has been struggling with food choices and exercise I want to see her back in 6 months as opposed to a year  [name]. [name], MD, FACS General, Bariatric, & Minimally Invasive Surgery [facility], [place]

## 2012-08-27 ENCOUNTER — Telehealth (INDEPENDENT_AMBULATORY_CARE_PROVIDER_SITE_OTHER): Payer: Self-pay | Admitting: General Surgery

## 2012-08-27 ENCOUNTER — Other Ambulatory Visit (INDEPENDENT_AMBULATORY_CARE_PROVIDER_SITE_OTHER): Payer: Self-pay | Admitting: General Surgery

## 2012-08-27 NOTE — Telephone Encounter (Signed)
    Atilano Ina, MD More Detail >>      Atilano Ina, MD [phone])      Sent: Thu August 26, 2012  9:13 PM    To: Joleene Burnham    MRN: [id number] DOB: 29-Dec-1970     Pt Work: [phone] Pt Home: [phone]           Message    pls also order hgb A1c on pt - she was a diabetic         [name]. [name], MD, FACS    General, Bariatric, & Minimally Invasive Surgery    [facility] [place]           [facility] at 12:17 on 6/6 for patient to call me back...just need to let her know that I ordered another lab test on her and make sure that she hasn't already went and had labs drawn this a.m..If so then she would need to go back and have this drawn as well

## 2012-08-27 NOTE — Telephone Encounter (Signed)
Got in touch with patient at 2:55 and notified her that I had added one more lab test to her orders and they should have them in the system..patient is aware and agreeable

## 2012-09-21 ENCOUNTER — Other Ambulatory Visit (INDEPENDENT_AMBULATORY_CARE_PROVIDER_SITE_OTHER): Payer: Self-pay | Admitting: General Surgery

## 2012-09-21 MED ORDER — BUPROPION HCL 100 MG PO TABS: 100.0000 mg | ORAL_TABLET | Freq: Three times a day (TID) | ORAL | Status: DC

## 2012-10-14 ENCOUNTER — Ambulatory Visit: Payer: BC Managed Care – PPO | Admitting: *Deleted

## 2012-10-15 ENCOUNTER — Other Ambulatory Visit (INDEPENDENT_AMBULATORY_CARE_PROVIDER_SITE_OTHER): Payer: Self-pay | Admitting: General Surgery

## 2012-10-16 LAB — IBC PANEL: %SAT: 21 % (ref 20–55)

## 2012-10-16 LAB — LIPID PANEL
Cholesterol: 134 mg/dL (ref 0–200)
HDL: 55 mg/dL (ref >39)
Total CHOL/HDL Ratio: 2.4 Ratio
Triglycerides: 48 mg/dL (ref <150)
VLDL: 10 mg/dL (ref 0–40)

## 2012-10-16 LAB — CMP AND LIVER
AST: 18 U/L (ref 0–37)
Albumin: 3.8 g/dL (ref 3.5–5.2)
Alkaline Phosphatase: 77 U/L (ref 39–117)
BUN: 10 mg/dL (ref 6–23)
Indirect Bilirubin: 0.4 mg/dL (ref 0.0–0.9)
Potassium: 4.3 mEq/L (ref 3.5–5.3)
Total Bilirubin: 0.5 mg/dL (ref 0.3–1.2)

## 2012-10-16 LAB — CBC WITH DIFFERENTIAL/PLATELET
Basophils Relative: 1 % (ref 0–1)
Eosinophils Absolute: 0.1 10*3/uL (ref 0.0–0.7)
Eosinophils Relative: 2 % (ref 0–5)
MCH: 26.9 pg (ref 26.0–34.0)
MCHC: 32.4 g/dL (ref 30.0–36.0)
MCV: 83 fL (ref 78.0–100.0)
Neutrophils Relative %: 49 % (ref 43–77)
Platelets: 321 10*3/uL (ref 150–400)
RDW: 14.2 % (ref 11.5–15.5)

## 2012-10-16 LAB — FOLATE: Folate: >20 ng/mL

## 2012-10-16 LAB — HEMOGLOBIN A1C
Hgb A1c MFr Bld: 6.3 % — ABNORMAL HIGH (ref <5.7)
Mean Plasma Glucose: 134 mg/dL — ABNORMAL HIGH (ref <117)

## 2012-10-16 LAB — MAGNESIUM: Magnesium: 2 mg/dL (ref 1.5–2.5)

## 2012-10-27 ENCOUNTER — Encounter: Payer: BC Managed Care – PPO | Attending: General Surgery | Admitting: *Deleted

## 2012-10-27 ENCOUNTER — Encounter: Payer: Self-pay | Admitting: *Deleted

## 2012-10-27 VITALS — Ht 67.0 in | Wt 235.5 lb

## 2012-10-27 DIAGNOSIS — E669 Obesity, unspecified: Secondary | ICD-10-CM

## 2012-10-27 DIAGNOSIS — Z713 Dietary counseling and surveillance: Secondary | ICD-10-CM | POA: Insufficient documentation

## 2012-10-27 DIAGNOSIS — Z9884 Bariatric surgery status: Secondary | ICD-10-CM | POA: Insufficient documentation

## 2012-10-27 DIAGNOSIS — Z09 Encounter for follow-up examination after completed treatment for conditions other than malignant neoplasm: Secondary | ICD-10-CM | POA: Insufficient documentation

## 2012-10-27 NOTE — Patient Instructions (Addendum)
Goals:  Follow Phase 3B: High Protein + Non-Starchy Vegetables  AVOID fast food and [employer] cookies!!! :)  Continue >30 min (150 min/week at least) of physical activity daily  Alert MD asap if hypoglycemia persists.

## 2012-10-27 NOTE — Progress Notes (Signed)
Follow-up visit:  13.5 Months Post-Operative RYGB Surgery  Medical Nutrition Therapy:  Appt start time:  1115  End time:  1145.  Primary concerns today: Post-operative Bariatric Surgery Nutrition Management. Virginia Stewart returns today with 2 lb weight gain. Reports episodes of hypoglycemia ~1 time/week. No longer on diabetes meds. Pt is seeing Dr. Cyndia Skeeters and believes her problem to be emotional eating. Started drinking regular coffee with regular flavored creamers and eating mini oreos and cookies from [place]. Discussed getting back on track.   Surgery date: 09/08/11 Surgery type: RYGB Start weight @ [facility]: 284.7 lbs  Weight today: 235.5 lbs Weight change: 2.0 lb GAIN Total weight lost: 49.2 lbs BMI: 36.9 kg/m^2  Goal weight: 160 lbs % goal met: 39%  TANITA  BODY COMP RESULTS  09/23/11 11/05/11 12/08/11 03/08/12 10/14/12 10/27/12   Fat Mass (lbs) 138.5 125.5 110.5 106.5 110.0 106.0   Fat Free Mass (lbs) 131.5 129.5 133.0 131.0 123.0 129.5   Total Body Water (lbs) 96.5 95.0 97.5 96.0 90.0 95.0   Fluid intake: 64+ oz (water, powerade zero, EDGE protein shakes @ 1-3/day) Estimated total protein intake:  60-80 g  Medications: No changes reported from last visit Supplementation: Taking regularly; plus iron, sublingual B complex, and biotin   CBG monitoring: ~ 2-3 times/week Average Fasting CBG per patient: 85 mg Last patient reported A1c:  Approx. 6.3%  (10/15/12)  Using straws: No Drinking while eating: No Hair loss: Resolving Carbonated beverages: No N/V/D/C:  Reports intolerance of pasta Dumping syndrome: Only with >9% alcohol beverages (wine). Thinks dumping is delayed because she may not feel effects until later in the day or next morning.   Recent physical activity:  Walks 2 miles/day at [employer] or [employer] and circuit training on football field. 3 days/week @ 30 min on elliptical. Free weights at home and DVDs   Progress Towards Goal(s):  In progress.   Nutritional Diagnosis:  [place]-3.3  Overweight/obesity related to recent RYGB surgery as evidenced by patient following post-op nutrition guidelines for continued weight loss.    Intervention:  Nutrition education.  Monitoring/Evaluation:  Dietary intake, exercise, and body weight. Follow up in 6 weeks for post-op visit.

## 2012-12-08 ENCOUNTER — Ambulatory Visit: Payer: BC Managed Care – PPO | Admitting: Dietician

## 2013-01-20 ENCOUNTER — Ambulatory Visit: Payer: Self-pay | Admitting: Podiatry

## 2013-03-10 ENCOUNTER — Ambulatory Visit (INDEPENDENT_AMBULATORY_CARE_PROVIDER_SITE_OTHER): Payer: BC Managed Care – PPO | Admitting: General Surgery

## 2013-04-14 ENCOUNTER — Ambulatory Visit (INDEPENDENT_AMBULATORY_CARE_PROVIDER_SITE_OTHER): Payer: BC Managed Care – PPO | Admitting: General Surgery

## 2013-04-26 ENCOUNTER — Telehealth (INDEPENDENT_AMBULATORY_CARE_PROVIDER_SITE_OTHER): Payer: Self-pay

## 2013-04-26 NOTE — Telephone Encounter (Signed)
LMOM stating that we r/s her appt with Dr Andrey CampanileWilson for 05/26/13 4:15/4:30 per her request for this time in the pm.

## 2013-05-04 ENCOUNTER — Telehealth (INDEPENDENT_AMBULATORY_CARE_PROVIDER_SITE_OTHER): Payer: Self-pay | Admitting: General Surgery

## 2013-05-04 NOTE — Telephone Encounter (Signed)
Called patient to let her know that Dr Andrey CampanileWilson strongly against hydrochloric therapy- no scientific benefit. We need bacteria in our colon. She can try digestive enzymes complex if she is having loose stools. And I remind patient to keep her apt on 05-26-2013 with Dr Andrey CampanileWilson

## 2013-05-26 ENCOUNTER — Other Ambulatory Visit (INDEPENDENT_AMBULATORY_CARE_PROVIDER_SITE_OTHER): Payer: Self-pay | Admitting: General Surgery

## 2013-05-26 ENCOUNTER — Ambulatory Visit (INDEPENDENT_AMBULATORY_CARE_PROVIDER_SITE_OTHER): Payer: BC Managed Care – PPO | Admitting: General Surgery

## 2013-05-26 ENCOUNTER — Encounter (INDEPENDENT_AMBULATORY_CARE_PROVIDER_SITE_OTHER): Payer: Self-pay | Admitting: General Surgery

## 2013-05-26 VITALS — BP 126/88 | HR 72 | Temp 97.7°F | Resp 16 | Ht 67.0 in | Wt 238.2 lb

## 2013-05-26 DIAGNOSIS — E669 Obesity, unspecified: Secondary | ICD-10-CM

## 2013-05-26 DIAGNOSIS — Z9884 Bariatric surgery status: Secondary | ICD-10-CM

## 2013-05-26 NOTE — Patient Instructions (Signed)
Make appt to see Dr Cyndia SkeetersLurey We will refer you to see Huntley DecSara - nutrition  Work on cutting back on sweet tea - empty calories! Work on exercise - it will help with stress - set small goals.

## 2013-05-27 NOTE — Progress Notes (Signed)
Subjective:     Patient ID: Virginia Stewart, female   DOB: October 19, 1970, 43 y.o.   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 I last saw her in the office on 08/26/2012.At that time she was suffering from some depression and poor food choices and had seen the psychologist. She states that she still having a significant amount of stress in her life. She is now back in the classroom teaching. She is still struggling with some depression. She is also making some poor food choices at times as well. She has not been exercising that often. She states that one of her vices is sweet tea. She has ordered the T-. 25 DVDs.  She denies any abdominal pain. She denies any nausea, vomiting, regurgitation. She reports she is taking her supplements. She does have some occasional bloating. She still having some issues with hair loss. She is having bowel movements at least every other day.  PMHx, PSHx, SOCHx, FAMHx, ALL reviewed and unchanged  Review of Systems 10 point ROS performed and negative except for HPI    Objective:   Physical Exam BP 126/88  Pulse 72  Temp(Src) 97.7 F (36.5 C) (Temporal)  Resp 16  Ht 5\' 7"  (1.702 m)  Wt 238 lb 3.2 oz (108.047 kg)  BMI 37.30 kg/m2  Gen: alert, NAD, non-toxic appearing Pupils: equal, no scleral icterus Pulm: Lungs clear to auscultation, symmetric chest rise CV: regular rate and rhythm Abd: soft, nontender, nondistended. Well-healed trocar sites. No cellulitis. No incisional hernia Ext: no edema, no calf tenderness Skin: no rash, no jaundice     Assessment:     S/p LRYGB 09/08/11 HPL DM 2 Joint pain     Plan:     Preop weight 288. Last visit weight 08/26/12 - 234. 4 pound weight gain since last visit.   We discussed her weight regain. We discussed the importance of good food choices. We discussed how sweet tea is such a poor food choice. I  suggested she start mixing it with unsweet tea and gradually wean herself off of the sweet tea. Unfortunately, she doesn't get Dumping as a result.   She was encouraged to continue with her daily supplements.  We discussed the importance of routine exercise in order to help prevent weight regain. We discussed several strategies such as using a pedometer and tracking her steps and setting weekly goals.  I encouraged her to followup with Dr. Karna DupesLurey givev her ongoing issues with depression. Because of poor food choices also recommended that she see our nutritionist in consultation for nutrition refresher.   She is due for lab surveillance which we will order.   Because of poor food choices, lack of exercise I want to see her back in 6 months as opposed to 1 year  Jadae Steinke M. Andrey CampanileWilson, MD, FACS General, Bariatric, & Minimally Invasive Surgery [facility] [place]

## 2013-05-30 ENCOUNTER — Other Ambulatory Visit (INDEPENDENT_AMBULATORY_CARE_PROVIDER_SITE_OTHER): Payer: Self-pay | Admitting: General Surgery

## 2013-06-02 ENCOUNTER — Encounter: Payer: Self-pay | Admitting: Podiatry

## 2013-06-02 ENCOUNTER — Ambulatory Visit (INDEPENDENT_AMBULATORY_CARE_PROVIDER_SITE_OTHER): Payer: BC Managed Care – PPO

## 2013-06-02 ENCOUNTER — Ambulatory Visit (INDEPENDENT_AMBULATORY_CARE_PROVIDER_SITE_OTHER): Payer: BC Managed Care – PPO | Admitting: Podiatry

## 2013-06-02 VITALS — BP 124/67 | HR 64 | Resp 12

## 2013-06-02 DIAGNOSIS — Q6650 Congenital pes planus, unspecified foot: Secondary | ICD-10-CM

## 2013-06-02 DIAGNOSIS — R52 Pain, unspecified: Secondary | ICD-10-CM

## 2013-06-02 DIAGNOSIS — M722 Plantar fascial fibromatosis: Secondary | ICD-10-CM

## 2013-06-02 MED ORDER — METHYLPREDNISOLONE (PAK) 4 MG PO TABS: ORAL_TABLET | ORAL | Status: DC

## 2013-06-02 NOTE — Progress Notes (Signed)
Subjective:     Patient ID: Virginia Stewart, female   DOB: October 19, 1970, 43 y.o.   MRN: [id number]  HPI PT STATED BOTH HEEL START BOTHERING ME AGAIN.   Review of Systems  Constitutional: Positive for fatigue.  Musculoskeletal: Positive for gait problem, joint swelling and myalgias.  All other systems reviewed and are negative.       Objective:   Physical Exam: I have reviewed her past medical history medications allergies surgeries and social history. Pulses are strongly palpable bilateral. She has pain on palpation medial continued tubercles bilateral heels.  Ready radiographic evaluation does demonstrate soft tissue increase in density at the plantar fascial continue insertion site indicative of plantar fasciitis.     Assessment:     Plantar fasciitis bilateral    Plan:     We discussed the etiology pathology conservative versus surgical therapies at this point I injected the bilateral heel. Put her in a plantar fascial brace is bilateral and night splint bilateral. With her prescription for Medrol Dosepak she cannot take nonsteroidals secondary to bypass.

## 2013-06-30 ENCOUNTER — Ambulatory Visit (INDEPENDENT_AMBULATORY_CARE_PROVIDER_SITE_OTHER): Payer: BC Managed Care – PPO | Admitting: Podiatry

## 2013-06-30 ENCOUNTER — Encounter: Payer: Self-pay | Admitting: Podiatry

## 2013-06-30 VITALS — BP 110/70 | HR 82 | Resp 12

## 2013-06-30 DIAGNOSIS — Q6650 Congenital pes planus, unspecified foot: Secondary | ICD-10-CM

## 2013-06-30 DIAGNOSIS — M722 Plantar fascial fibromatosis: Secondary | ICD-10-CM

## 2013-06-30 NOTE — Progress Notes (Signed)
She presents today for followup of her plantar fasciitis states that they're doing much better.  Objective: Vital signs are stable she is alert and oriented x3. Pulses are strongly palpable bilateral and there is no calf pain bilateral. She has pain on palpation medial continued tubercles bilateral.  Assessment: Pain in limb secondary to plantar fasciitis bilateral.  Plan: Injected bilateral heels today with Kenalog and local anesthetic. Continue all other conservative therapies at home. She was scanned today before she left the office for orthotics. She was also dispensed a pair of plantar fascial brace is. I will followup with her once her orthotics come in.

## 2013-07-02 ENCOUNTER — Other Ambulatory Visit (INDEPENDENT_AMBULATORY_CARE_PROVIDER_SITE_OTHER): Payer: Self-pay | Admitting: General Surgery

## 2013-07-04 NOTE — Telephone Encounter (Signed)
yes

## 2013-07-04 NOTE — Telephone Encounter (Signed)
Can this patient have this Rx 

## 2013-08-12 ENCOUNTER — Ambulatory Visit (INDEPENDENT_AMBULATORY_CARE_PROVIDER_SITE_OTHER): Payer: BC Managed Care – PPO | Admitting: *Deleted

## 2013-08-12 DIAGNOSIS — M722 Plantar fascial fibromatosis: Secondary | ICD-10-CM

## 2013-08-12 NOTE — Patient Instructions (Signed)

## 2013-08-12 NOTE — Progress Notes (Signed)
   Subjective:    Patient ID: Virginia Stewart, female    DOB: 1970/06/04, 43 y.o.   MRN: [id number]  HPI PICK UP ORTHOTICS AND GIVEN INSTRUCTION.   Review of Systems     Objective:   Physical Exam        Assessment & Plan:

## 2013-09-20 ENCOUNTER — Other Ambulatory Visit (INDEPENDENT_AMBULATORY_CARE_PROVIDER_SITE_OTHER): Payer: Self-pay | Admitting: General Surgery

## 2013-09-20 LAB — CBC WITH DIFFERENTIAL/PLATELET
BASOS ABS: 0.1 10*3/uL (ref 0.0–0.1)
Basophils Relative: 1 % (ref 0–1)
EOS PCT: 2 % (ref 0–5)
Eosinophils Absolute: 0.1 10*3/uL (ref 0.0–0.7)
HCT: 33.8 % — ABNORMAL LOW (ref 36.0–46.0)
Hemoglobin: 11.4 g/dL — ABNORMAL LOW (ref 12.0–15.0)
LYMPHS ABS: 2.1 10*3/uL (ref 0.7–4.0)
LYMPHS PCT: 40 % (ref 12–46)
MCH: 27.7 pg (ref 26.0–34.0)
MCHC: 33.7 g/dL (ref 30.0–36.0)
MCV: 82.2 fL (ref 78.0–100.0)
Monocytes Absolute: 0.4 10*3/uL (ref 0.1–1.0)
Monocytes Relative: 7 % (ref 3–12)
NEUTROS ABS: 2.7 10*3/uL (ref 1.7–7.7)
NEUTROS PCT: 50 % (ref 43–77)
PLATELETS: 329 10*3/uL (ref 150–400)
RBC: 4.11 MIL/uL (ref 3.87–5.11)
RDW: 13.5 % (ref 11.5–15.5)
WBC: 5.3 10*3/uL (ref 4.0–10.5)

## 2013-09-21 LAB — IRON AND TIBC
%SAT: 30 % (ref 20–55)
IRON: 104 ug/dL (ref 42–145)
TIBC: 343 ug/dL (ref 250–470)
UIBC: 239 ug/dL (ref 125–400)

## 2013-09-21 LAB — LIPID PANEL
CHOL/HDL RATIO: 2.5 ratio
Cholesterol: 144 mg/dL (ref 0–200)
HDL: 58 mg/dL (ref >39)
LDL CALC: 76 mg/dL (ref 0–99)
Triglycerides: 48 mg/dL (ref <150)
VLDL: 10 mg/dL (ref 0–40)

## 2013-09-21 LAB — CMP AND LIVER
ALBUMIN: 3.8 g/dL (ref 3.5–5.2)
ALK PHOS: 66 U/L (ref 39–117)
ALT: 22 U/L (ref 0–35)
AST: 22 U/L (ref 0–37)
BILIRUBIN INDIRECT: 0.4 mg/dL (ref 0.2–1.2)
BUN: 7 mg/dL (ref 6–23)
Bilirubin, Direct: 0.1 mg/dL (ref 0.0–0.3)
CO2: 30 meq/L (ref 19–32)
Calcium: 9.2 mg/dL (ref 8.4–10.5)
Chloride: 99 mEq/L (ref 96–112)
Creat: 0.63 mg/dL (ref 0.50–1.10)
GLUCOSE: 136 mg/dL — AB (ref 70–99)
POTASSIUM: 4.3 meq/L (ref 3.5–5.3)
SODIUM: 136 meq/L (ref 135–145)
TOTAL PROTEIN: 7.1 g/dL (ref 6.0–8.3)
Total Bilirubin: 0.5 mg/dL (ref 0.2–1.2)

## 2013-09-21 LAB — VITAMIN D 25 HYDROXY (VIT D DEFICIENCY, FRACTURES): Vit D, 25-Hydroxy: 29 ng/mL — ABNORMAL LOW (ref 30–89)

## 2013-09-21 LAB — VITAMIN B12: Vitamin B-12: 1547 pg/mL — ABNORMAL HIGH (ref 211–911)

## 2013-09-21 LAB — FOLATE: FOLATE: 13.2 ng/mL

## 2013-09-21 LAB — MAGNESIUM: MAGNESIUM: 1.8 mg/dL (ref 1.5–2.5)

## 2013-09-26 ENCOUNTER — Telehealth (INDEPENDENT_AMBULATORY_CARE_PROVIDER_SITE_OTHER): Payer: Self-pay | Admitting: General Surgery

## 2013-09-26 ENCOUNTER — Telehealth (INDEPENDENT_AMBULATORY_CARE_PROVIDER_SITE_OTHER): Payer: Self-pay

## 2013-09-26 ENCOUNTER — Other Ambulatory Visit (INDEPENDENT_AMBULATORY_CARE_PROVIDER_SITE_OTHER): Payer: Self-pay

## 2013-09-26 MED ORDER — BUPROPION HCL 100 MG PO TABS: 100.0000 mg | ORAL_TABLET | Freq: Three times a day (TID) | ORAL | Status: DC

## 2013-09-26 NOTE — Telephone Encounter (Signed)
Called patient and went over her lab work. 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 I told her that she will need to start taking Vit D3 50,000 units twice a week for 6 weeks. I made her apt to see Dr Andrey CampanileWilson in September and I mailed out an apt card. And she has an apt to see Huntley DecSara and Dr. Cyndia SkeetersLurey

## 2013-09-26 NOTE — Telephone Encounter (Signed)
LMOM for patient to call back and ask for Brandan Robicheaux 

## 2013-09-26 NOTE — Telephone Encounter (Signed)
Pt s/p RNY. Pt would like to get a refill on her Wellbutrin 100mg  . Pt states that she is completely out and she is out of town and would like [facility] [place], [place] [phone]. Informed pt that Dr Andrey CampanileWilson is out of the office today so I will send this to one of his partners. Pt verbalized understanding.

## 2013-09-26 NOTE — Telephone Encounter (Signed)
Please refer to this prescription refill request to one of the bariatric surgeons.   hmi

## 2013-09-26 NOTE — Telephone Encounter (Signed)
Left message for pt to call nursing triage. Spoke with Dr Gerrit FriendsGerkin, he refilled Wellbutrin 100mg  TID #14 [id number] refills. [facility] [place].

## 2013-09-30 ENCOUNTER — Other Ambulatory Visit (INDEPENDENT_AMBULATORY_CARE_PROVIDER_SITE_OTHER): Payer: Self-pay | Admitting: General Surgery

## 2013-10-03 NOTE — Telephone Encounter (Signed)
Yes. +1 refill 

## 2013-10-03 NOTE — Telephone Encounter (Signed)
Can this patient have this Rx 

## 2013-10-12 ENCOUNTER — Telehealth (INDEPENDENT_AMBULATORY_CARE_PROVIDER_SITE_OTHER): Payer: Self-pay

## 2013-10-12 DIAGNOSIS — E559 Vitamin D deficiency, unspecified: Secondary | ICD-10-CM

## 2013-10-12 NOTE — Telephone Encounter (Signed)
Pt states she cannot locate vit d 3 in  [id number] units. The largest unit she can find is 1000 units. Pt requests advise on how much vit d 3 she should take. Pt states she cannot take that many tablets a day. Pt advised this msg will be sent to Dr Andrey CampanileWilson for review. Pt can be reached at [phone].

## 2013-10-13 MED ORDER — VITAMIN D3 1.25 MG (50000 UT) PO CAPS: 1.0000 | ORAL_CAPSULE | ORAL | Status: AC

## 2013-10-13 NOTE — Telephone Encounter (Signed)
pls let pt know I sent in a [facility] to [facility] in [place]. They may have to order it if not in stock. Apologize for inconvenience.

## 2013-10-13 NOTE — Addendum Note (Signed)
Addended by: Andrey CampanileWILSON, Anastacia Reinecke M on: 10/13/2013 03:12 PM   Modules accepted: Orders

## 2013-11-22 ENCOUNTER — Ambulatory Visit: Payer: BC Managed Care – PPO | Admitting: Podiatry

## 2013-11-29 ENCOUNTER — Encounter: Payer: Self-pay | Admitting: Podiatry

## 2013-11-29 ENCOUNTER — Ambulatory Visit (INDEPENDENT_AMBULATORY_CARE_PROVIDER_SITE_OTHER): Payer: BC Managed Care – PPO | Admitting: Podiatry

## 2013-11-29 VITALS — BP 100/60 | HR 68 | Resp 16

## 2013-11-29 DIAGNOSIS — M722 Plantar fascial fibromatosis: Secondary | ICD-10-CM

## 2013-11-29 NOTE — Progress Notes (Signed)
She presents today for followup wearing her orthotics stating that her plantar fasciitis is starting to feel better however she has tenderness on outside of her foot.  Objective: Vital signs are stable she is alert and oriented x3 she has no calf pain. She has tenderness on lateral aspect of the foot that the majority of her pain is located at the plantar fascial calcaneal insertion site.  Assessment: Plantar fasciitis bilateral.  Plan: Reinjected the bilateral foot today continue use of the orthotics and all conservative therapies discussed the possible need for surgery. She is amenable to it.

## 2013-12-15 ENCOUNTER — Ambulatory Visit (INDEPENDENT_AMBULATORY_CARE_PROVIDER_SITE_OTHER): Payer: BC Managed Care – PPO | Admitting: General Surgery

## 2013-12-27 ENCOUNTER — Ambulatory Visit: Payer: BC Managed Care – PPO | Admitting: Podiatry

## 2014-02-22 ENCOUNTER — Other Ambulatory Visit (INDEPENDENT_AMBULATORY_CARE_PROVIDER_SITE_OTHER): Payer: Self-pay | Admitting: General Surgery

## 2014-02-22 ENCOUNTER — Other Ambulatory Visit (INDEPENDENT_AMBULATORY_CARE_PROVIDER_SITE_OTHER): Payer: Self-pay

## 2014-02-22 DIAGNOSIS — K909 Intestinal malabsorption, unspecified: Secondary | ICD-10-CM

## 2014-02-22 DIAGNOSIS — E559 Vitamin D deficiency, unspecified: Secondary | ICD-10-CM

## 2014-02-22 DIAGNOSIS — Z9884 Bariatric surgery status: Secondary | ICD-10-CM

## 2014-02-22 DIAGNOSIS — D509 Iron deficiency anemia, unspecified: Secondary | ICD-10-CM

## 2014-04-13 DIAGNOSIS — F32A Depression, unspecified: Secondary | ICD-10-CM | POA: Insufficient documentation

## 2014-04-13 DIAGNOSIS — F329 Major depressive disorder, single episode, unspecified: Secondary | ICD-10-CM | POA: Insufficient documentation

## 2014-05-10 DIAGNOSIS — J45991 Cough variant asthma: Secondary | ICD-10-CM | POA: Insufficient documentation

## 2014-05-10 DIAGNOSIS — Z8639 Personal history of other endocrine, nutritional and metabolic disease: Secondary | ICD-10-CM | POA: Insufficient documentation

## 2014-05-10 DIAGNOSIS — J3089 Other allergic rhinitis: Secondary | ICD-10-CM | POA: Insufficient documentation

## 2014-05-10 DIAGNOSIS — Z9884 Bariatric surgery status: Secondary | ICD-10-CM | POA: Insufficient documentation

## 2014-05-25 DIAGNOSIS — R41840 Attention and concentration deficit: Secondary | ICD-10-CM | POA: Insufficient documentation

## 2014-11-23 ENCOUNTER — Other Ambulatory Visit: Payer: Self-pay

## 2014-11-23 DIAGNOSIS — T730XXA Starvation, initial encounter: Secondary | ICD-10-CM

## 2014-11-23 DIAGNOSIS — Z9884 Bariatric surgery status: Secondary | ICD-10-CM

## 2014-11-23 DIAGNOSIS — R635 Abnormal weight gain: Secondary | ICD-10-CM

## 2016-04-03 DIAGNOSIS — M222X2 Patellofemoral disorders, left knee: Secondary | ICD-10-CM

## 2016-04-03 DIAGNOSIS — M222X1 Patellofemoral disorders, right knee: Secondary | ICD-10-CM | POA: Insufficient documentation

## 2016-04-07 ENCOUNTER — Ambulatory Visit: Payer: BC Managed Care – PPO | Admitting: Podiatry

## 2016-05-26 DIAGNOSIS — R768 Other specified abnormal immunological findings in serum: Secondary | ICD-10-CM | POA: Insufficient documentation

## 2016-05-27 DIAGNOSIS — F321 Major depressive disorder, single episode, moderate: Secondary | ICD-10-CM | POA: Insufficient documentation

## 2016-06-19 ENCOUNTER — Other Ambulatory Visit: Payer: Self-pay | Admitting: Family

## 2016-06-19 DIAGNOSIS — Z1231 Encounter for screening mammogram for malignant neoplasm of breast: Secondary | ICD-10-CM

## 2016-07-14 ENCOUNTER — Ambulatory Visit: Payer: Self-pay

## 2016-07-16 ENCOUNTER — Ambulatory Visit
Admission: RE | Admit: 2016-07-16 | Discharge: 2016-07-16 | Disposition: A | Discharge status: Home / Self Care | Payer: BC Managed Care – PPO | Source: Ambulatory Visit | Attending: Family | Admitting: Family

## 2016-07-16 DIAGNOSIS — Z1231 Encounter for screening mammogram for malignant neoplasm of breast: Secondary | ICD-10-CM

## 2016-07-24 ENCOUNTER — Ambulatory Visit: Payer: BC Managed Care – PPO | Admitting: Podiatry

## 2016-07-25 ENCOUNTER — Encounter: Payer: Self-pay | Admitting: Podiatry

## 2016-07-25 ENCOUNTER — Ambulatory Visit (INDEPENDENT_AMBULATORY_CARE_PROVIDER_SITE_OTHER): Payer: BC Managed Care – PPO

## 2016-07-25 ENCOUNTER — Ambulatory Visit (INDEPENDENT_AMBULATORY_CARE_PROVIDER_SITE_OTHER): Payer: BC Managed Care – PPO | Admitting: Podiatry

## 2016-07-25 VITALS — Ht 67.0 in | Wt 234.0 lb

## 2016-07-25 DIAGNOSIS — M79673 Pain in unspecified foot: Secondary | ICD-10-CM

## 2016-07-25 DIAGNOSIS — M722 Plantar fascial fibromatosis: Secondary | ICD-10-CM

## 2016-07-25 MED ORDER — TRIAMCINOLONE ACETONIDE 10 MG/ML IJ SUSP
10.0000 mg | Freq: Once | INTRAMUSCULAR | Status: AC
  Administered 2016-07-25: 10 mg

## 2016-07-28 NOTE — Progress Notes (Signed)
Subjective:    Patient ID: Virginia Stewart, female   DOB: 46 y.o.   MRN: [id number]   HPI patient presents stating that she's had a lot of problems with her both her heels and she no she needs new orthotics    ROS      Objective:  Physical Exam Neurovascular status intact muscle strength adequate with patient noted to have quite a bit of discomfort in the plantar heel region bilateral with inflammation fluid around the medial band    Assessment:   Chronic plantar fasciitis with inflammation fluid of the medial band      Plan:    Condition reviewed and today I injected the plantar fascia bilateral 3 mg Kenalog 5 g Xylocaine and instructed on long-term orthotics and scanned for customized orthotic devices

## 2016-08-14 ENCOUNTER — Ambulatory Visit: Payer: BC Managed Care – PPO | Admitting: Podiatry

## 2016-08-27 ENCOUNTER — Encounter: Payer: BC Managed Care – PPO | Admitting: Podiatry

## 2016-08-27 NOTE — Patient Instructions (Signed)

## 2016-09-26 ENCOUNTER — Ambulatory Visit: Payer: BC Managed Care – PPO | Admitting: Podiatry

## 2016-10-01 ENCOUNTER — Other Ambulatory Visit: Payer: BC Managed Care – PPO

## 2016-10-09 ENCOUNTER — Ambulatory Visit: Payer: BC Managed Care – PPO | Admitting: Orthotics

## 2016-10-09 DIAGNOSIS — M722 Plantar fascial fibromatosis: Secondary | ICD-10-CM

## 2016-10-09 NOTE — Progress Notes (Signed)
Patient came in today to pick up custom made foot orthotics.  The goals were accomplished and the patient reported no dissatisfaction with said orthotics.  Patient was advised of breakin period and how to report any issues. 

## 2016-11-26 NOTE — Progress Notes (Signed)
This encounter was created in error - please disregard.

## 2017-04-01 ENCOUNTER — Encounter (HOSPITAL_BASED_OUTPATIENT_CLINIC_OR_DEPARTMENT_OTHER): Payer: Self-pay | Admitting: Emergency Medicine

## 2017-04-01 ENCOUNTER — Other Ambulatory Visit: Payer: Self-pay

## 2017-04-01 ENCOUNTER — Emergency Department (HOSPITAL_BASED_OUTPATIENT_CLINIC_OR_DEPARTMENT_OTHER): Payer: BC Managed Care – PPO

## 2017-04-01 ENCOUNTER — Emergency Department (HOSPITAL_BASED_OUTPATIENT_CLINIC_OR_DEPARTMENT_OTHER)
Admission: EM | Admit: 2017-04-01 | Discharge: 2017-04-01 | Disposition: A | Discharge status: Home / Self Care | Payer: BC Managed Care – PPO | Attending: Emergency Medicine | Admitting: Emergency Medicine

## 2017-04-01 DIAGNOSIS — E039 Hypothyroidism, unspecified: Secondary | ICD-10-CM | POA: Insufficient documentation

## 2017-04-01 DIAGNOSIS — E119 Type 2 diabetes mellitus without complications: Secondary | ICD-10-CM | POA: Insufficient documentation

## 2017-04-01 DIAGNOSIS — R519 Headache, unspecified: Secondary | ICD-10-CM

## 2017-04-01 DIAGNOSIS — J45909 Unspecified asthma, uncomplicated: Secondary | ICD-10-CM | POA: Insufficient documentation

## 2017-04-01 DIAGNOSIS — R51 Headache: Secondary | ICD-10-CM | POA: Insufficient documentation

## 2017-04-01 DIAGNOSIS — Z87891 Personal history of nicotine dependence: Secondary | ICD-10-CM | POA: Diagnosis not present

## 2017-04-01 DIAGNOSIS — E860 Dehydration: Secondary | ICD-10-CM | POA: Diagnosis not present

## 2017-04-01 DIAGNOSIS — Z79899 Other long term (current) drug therapy: Secondary | ICD-10-CM | POA: Diagnosis not present

## 2017-04-01 LAB — COMPREHENSIVE METABOLIC PANEL
ALK PHOS: 73 U/L (ref 38–126)
ALT: 16 U/L (ref 14–54)
ANION GAP: 6 (ref 5–15)
AST: 25 U/L (ref 15–41)
Albumin: 3.7 g/dL (ref 3.5–5.0)
BILIRUBIN TOTAL: 0.4 mg/dL (ref 0.3–1.2)
BUN: 8 mg/dL (ref 6–20)
CO2: 27 mmol/L (ref 22–32)
Calcium: 8.9 mg/dL (ref 8.9–10.3)
Chloride: 102 mmol/L (ref 101–111)
Creatinine, Ser: 0.54 mg/dL (ref 0.44–1.00)
GLUCOSE: 114 mg/dL — AB (ref 65–99)
Potassium: 3.7 mmol/L (ref 3.5–5.1)
Sodium: 135 mmol/L (ref 135–145)
TOTAL PROTEIN: 7.5 g/dL (ref 6.5–8.1)

## 2017-04-01 LAB — URINALYSIS, ROUTINE W REFLEX MICROSCOPIC
BILIRUBIN URINE: NEGATIVE
Glucose, UA: 250 mg/dL — AB
KETONES UR: 15 mg/dL — AB
Leukocytes, UA: NEGATIVE
NITRITE: NEGATIVE
PH: 7 (ref 5.0–8.0)
Protein, ur: NEGATIVE mg/dL
Specific Gravity, Urine: 1.02 (ref 1.005–1.030)

## 2017-04-01 LAB — PREGNANCY, URINE: PREG TEST UR: NEGATIVE

## 2017-04-01 LAB — URINALYSIS, MICROSCOPIC (REFLEX)

## 2017-04-01 LAB — MAGNESIUM: Magnesium: 1.7 mg/dL (ref 1.7–2.4)

## 2017-04-01 LAB — CBC
HEMATOCRIT: 30.3 % — AB (ref 36.0–46.0)
HEMOGLOBIN: 9.6 g/dL — AB (ref 12.0–15.0)
MCH: 25.8 pg — AB (ref 26.0–34.0)
MCHC: 31.7 g/dL (ref 30.0–36.0)
MCV: 81.5 fL (ref 78.0–100.0)
Platelets: 352 10*3/uL (ref 150–400)
RBC: 3.72 MIL/uL — ABNORMAL LOW (ref 3.87–5.11)
RDW: 15.2 % (ref 11.5–15.5)
WBC: 6.3 10*3/uL (ref 4.0–10.5)

## 2017-04-01 MED ORDER — PROCHLORPERAZINE EDISYLATE 5 MG/ML IJ SOLN: 5.0000 mg | Freq: Once | INTRAMUSCULAR | Status: DC

## 2017-04-01 MED ORDER — ACETAMINOPHEN 500 MG PO TABS
1000.0000 mg | ORAL_TABLET | Freq: Once | ORAL | Status: DC
  Filled 2017-04-01: qty 2

## 2017-04-01 MED ORDER — SODIUM CHLORIDE 0.9 % IV BOLUS (SEPSIS)
1000.0000 mL | Freq: Once | INTRAVENOUS | Status: AC
  Administered 2017-04-01: 1000 mL via INTRAVENOUS

## 2017-04-01 MED ORDER — DIPHENHYDRAMINE HCL 50 MG/ML IJ SOLN
25.0000 mg | Freq: Once | INTRAMUSCULAR | Status: DC
Start: 2017-04-01 — End: 2017-04-01

## 2017-04-01 MED ORDER — KETOROLAC TROMETHAMINE 30 MG/ML IJ SOLN
15.0000 mg | Freq: Once | INTRAMUSCULAR | Status: AC
  Administered 2017-04-01: 15 mg via INTRAVENOUS
  Filled 2017-04-01: qty 1

## 2017-04-01 MED ORDER — KETOROLAC TROMETHAMINE 30 MG/ML IJ SOLN: 15.0000 mg | Freq: Once | INTRAMUSCULAR | Status: DC

## 2017-04-01 MED ORDER — METOCLOPRAMIDE HCL 5 MG/ML IJ SOLN
10.0000 mg | Freq: Once | INTRAMUSCULAR | Status: AC
  Administered 2017-04-01: 10 mg via INTRAVENOUS
  Filled 2017-04-01: qty 2

## 2017-04-01 NOTE — ED Notes (Signed)
Patient states that she does not want the OTC medication cause she can take her own at home and she does not want to in dure the cost of an OTC medication in the hospital

## 2017-04-01 NOTE — ED Provider Notes (Signed)
[facility] EMERGENCY DEPARTMENT Provider Note   CSN: [id number] Arrival date & time: 04/01/17  0855     History   Chief Complaint Chief Complaint  Patient presents with  . Headache    HPI Virginia Stewart is a 47 y.o. female with a past medical history of morbid obesity, gastric bypass surgery who presents the emergency department for evaluation of headache.  Patient states that she does not normally get headaches.  2 days ago she developed what felt like a bubble going up the back of her neck and popping behind her ear.  She states she had some sharp pain along her scalp.  It lasted for a few seconds.  She felt extremely tired and dizzy afterward and had a dull headache for the past 2 days.  She did not take any medication for it.  She states that her dizziness is positional.  She was unable to sleep last night which is not abnormal for her she sometimes suffers from insomnia.  She has had some mild nausea without vomiting.  She denies any changes in her vision.  She states that she feels "off."  Denies photophobia, phonophobia, ataxia, vertigo.  She denies any recent URI symptoms, ear pain, nasal congestion.  HPI  Past Medical History:  Diagnosis Date  . Anemia    borderline  . Anginal pain (HCC) 8/12   chest x ray, ekg epic- states was told anxiety attack- none since  . Asthma    with seasonal allergies  . Depression   . Diabetes mellitus 2003  . Headache(784.0)   . Hyperlipidemia   . Hypothyroidism    borderline  . Insulin dependent type 2 diabetes mellitus (HCC)   . Morbid obesity [facility])     Patient Active Problem List   Diagnosis Date Noted  . Obesity (BMI 30-39.9) 06/24/2012  . 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 Insulin dependent type 2 diabetes mellitus (HCC) 06/12/2011  . HYPERLIPIDEMIA 07/31/2008    Past Surgical History:  Procedure Laterality Date  . CESAREAN SECTION  04/16/2007  . GASTRIC ROUX-EN-Y  09/08/2011   Procedure:  LAPAROSCOPIC ROUX-EN-Y GASTRIC;  Surgeon: Atilano Ina, MD,FACS;  Location: [facility] ORS;  Service: General;  Laterality: N/A;    OB History    No data available       Home Medications    Prior to Admission medications   Medication Sig Start Date End Date Taking? Authorizing Provider  B Complex Vitamins (VITAMIN-B COMPLEX) TABS Take by mouth.    [provider]  BIOTIN PO Take 10,000 mg by mouth daily.     [provider]  buPROPion (WELLBUTRIN) 100 MG tablet Take 1 tablet (100 mg total) by mouth 3 (three) times daily. Patient not taking: Reported on 07/25/2016 09/26/13   Darnell Level, MD  buPROPion (WELLBUTRIN) 100 MG tablet TAKE 1 TABLET BY MOUTH THREE TIMES DAILY Patient not taking: Reported on 07/25/2016    Gaynelle Adu, MD  busPIRone (BUSPAR) 15 MG tablet Take 15 mg by mouth. 07/24/15   [provider]  calcium citrate-vitamin D (CITRACAL+D) 315-200 MG-UNIT tablet Take by mouth.    [provider]  Cholecalciferol (VITAMIN D3) 50000 UNITS CAPS Take 1 capsule by mouth every Monday, Wednesday, and Friday. 10/13/13   Gaynelle Adu, MD  citalopram (CELEXA) 20 MG tablet  05/23/13   [provider]  IRON PO Take 65 mg by mouth daily.    [provider]  methylPREDNIsolone (MEDROL DOSPACK) 4 MG  tablet follow package directions Patient not taking: Reported on 07/25/2016 06/02/13   Hyatt, Max T, DPM  valACYclovir (VALTREX) 500 MG tablet Take by mouth. 01/22/16   [provider]  vortioxetine HBr (TRINTELLIX) 10 MG TABS TAKE ONE TABLET BY MOUTH ONCE DAILY 12/21/15   [provider]    Family History Family History  Problem Relation Age of Onset  . Diabetes Mother   . Hypertension Mother   . Diabetes Father   . Lung cancer Father     Social History Social History   Tobacco Use  . Smoking status: Former Smoker    Last attempt to quit: 06/11/2004    Years since quitting: 12.8  . Smokeless tobacco: Never Used  Substance Use  Topics  . Alcohol use: Yes    Alcohol/week: 1.0 - 1.5 oz    Types: 2 - 3 drink(s) per week    Comment: red wine  . Drug use: No     Allergies   Patient has no known allergies.   Review of Systems Review of Systems Ten systems reviewed and are negative for acute change, except as noted in the HPI.    Physical  Updated Vital Signs BP 114/67 (BP Location: Left Arm)   Pulse 90   Temp 98 F (36.7 C) (Oral)   Resp 18   Ht 5\' 7"  (1.702 m)   Wt 110.7 kg (244 lb)   LMP 03/31/2017   SpO2 100%   BMI 38.22 kg/m   Physical Exam  Constitutional: She is oriented to person, place, and time. She appears well-developed and well-nourished. No distress.  HENT:  Head: Normocephalic and atraumatic.  Right Ear: External ear normal.  Left Ear: External ear normal.  Mouth/Throat: Oropharynx is clear and moist. No oropharyngeal exudate.  Eyes: Conjunctivae and EOM are normal. Pupils are equal, round, and reactive to light. No scleral icterus.  No horizontal, vertical or rotational nystagmus  Neck: Normal range of motion. Neck supple. No JVD present. No thyromegaly present.  Full active and passive ROM without pain No midline or paraspinal tenderness No nuchal rigidity or meningeal signs  Cardiovascular: Normal rate, regular rhythm, normal heart sounds and intact distal pulses. Exam reveals no gallop and no friction rub.  No murmur heard. Pulmonary/Chest: Effort normal and breath sounds normal. No respiratory distress. She has no wheezes. She has no rales.  Abdominal: Soft. Bowel sounds are normal. She exhibits no distension and no mass. There is no tenderness. There is no rebound and no guarding.  Musculoskeletal: Normal range of motion. She exhibits no edema or tenderness.  No meningismus  Lymphadenopathy:    She has no cervical adenopathy.  Neurological: She is alert and oriented to person, place, and time. She has normal reflexes. No cranial nerve deficit. She exhibits normal muscle  tone. Coordination normal.  Mental Status:  Alert, oriented, thought content appropriate. Speech fluent without evidence of aphasia. Able to follow 2 step commands without difficulty.  Cranial Nerves:  II:  Peripheral visual fields grossly normal, pupils equal, round, reactive to light III,IV, VI: ptosis not present, extra-ocular motions intact bilaterally  V,VII: smile symmetric, facial light touch sensation equal VIII: hearing grossly normal bilaterally  IX,X: midline uvula rise  XI: bilateral shoulder shrug equal and strong XII: midline tongue extension  Motor:  5/5 in upper and lower extremities bilaterally including strong and equal grip strength and dorsiflexion/plantar flexion Sensory: Pinprick and light touch normal in all extremities.  Cerebellar: normal finger-to-nose with bilateral upper extremities  Gait: normal gait and balance CV: distal pulses palpable throughout   Skin: Skin is warm and dry. No rash noted. She is not diaphoretic.  Psychiatric: She has a normal mood and affect. Her behavior is normal. Judgment and thought content normal.  Nursing note and vitals reviewed.    ED Treatments / Results  Labs (all labs ordered are listed, but only abnormal results are displayed) Labs Reviewed - No data to display  EKG  EKG Interpretation None       Radiology No results found.  Procedures Procedures (including critical care time)  Medications Ordered in ED Medications - No data to display   Initial Impression / Assessment and Plan / ED Course  I have reviewed the triage vital signs and the nursing notes.  Pertinent labs & imaging results that were available during my care of the patient were reviewed by me and considered in my medical decision making (see chart for details).  Clinical Course as of Apr 02 1199  Wed Apr 01, 2017  1157 Hemoglobin: (!) 9.6 [AH]    Clinical Course User Index [AH] Arthor CaptainHarris, Damian Buckles, PA-C    Benita Stabileonja D McPhail is a 47 y.o. female  who presents to ED for  headache  No focal neuro deficits on exam.  Migraine cocktail and fluids given.   On re-evaluation, patient symptoms are resolved. The patient denies any neurologic symptoms such as visual changes, focal numbness/weakness, balance problems, confusion, or speech difficulty to suggest a life-threatening intracranial process such as intracranial hemorrhage or mass. The patient has no clotting risk factors thus venous sinus thrombosis is unlikely. No fevers, neck pain or nuchal rigidity to suggest meningitis. I feel that the patient is safe for discharge home at this time. PCP follow up strongly encouraged. I have reviewed return precautions including development of neurologic symptoms, confusion, lethargy, difficulty speaking, or new/worsening/concerning symptoms. All questions answered.   Final Clinical Impressions(s) / ED Diagnoses   Final diagnoses:  None    ED Discharge Orders    None       Arthor CaptainHarris, Jerek Meulemans, PA-C 04/01/17 1837    Doug SouJacubowitz, Sam, MD 04/02/17 [id number]

## 2017-04-01 NOTE — ED Provider Notes (Signed)
Complains of headache gradual onset 2 days ago.  Symptoms accompanied by photophobia.  She feels as if there was a "pop behind her left ear" 2 days ago.  She denies any loss of consciousness.  She does admit to lightheadedness which is worse with standing.  No other associated symptoms.  On exam alert and nontoxic appearing  Glasgow Coma Score 15 cranial nerves II through XII grossly intact gait normal motor strength 5/5 overall.   Doug SouJacubowitz, Karilyn Wind, MD 04/01/17 1134

## 2017-04-01 NOTE — ED Notes (Signed)
Patient transported to CT ambulatory 

## 2017-04-01 NOTE — Discharge Instructions (Signed)

## 2017-04-01 NOTE — ED Notes (Signed)
ED Provider at bedside. 

## 2017-04-01 NOTE — ED Triage Notes (Signed)
Patient states that she has had an "aura" for that last few weeks. The patient reports that she now has a "airbubble" feeling to her right head and behind her ear that popped. Th patient reports that she may have had some LOC on Monday with the popping in feeling

## 2017-05-20 ENCOUNTER — Ambulatory Visit: Payer: BC Managed Care – PPO | Admitting: Podiatry

## 2017-05-20 ENCOUNTER — Encounter: Payer: Self-pay | Admitting: Podiatry

## 2017-05-20 ENCOUNTER — Ambulatory Visit (INDEPENDENT_AMBULATORY_CARE_PROVIDER_SITE_OTHER): Payer: BC Managed Care – PPO

## 2017-05-20 ENCOUNTER — Other Ambulatory Visit: Payer: Self-pay | Admitting: Podiatry

## 2017-05-20 DIAGNOSIS — M2021 Hallux rigidus, right foot: Secondary | ICD-10-CM

## 2017-05-20 DIAGNOSIS — M79672 Pain in left foot: Secondary | ICD-10-CM | POA: Diagnosis not present

## 2017-05-20 DIAGNOSIS — M722 Plantar fascial fibromatosis: Secondary | ICD-10-CM

## 2017-05-20 MED ORDER — TRIAMCINOLONE ACETONIDE 10 MG/ML IJ SUSP
10.0000 mg | Freq: Once | INTRAMUSCULAR | Status: AC
  Administered 2017-05-20: 10 mg

## 2017-05-20 NOTE — Progress Notes (Signed)
Subjective:   Patient ID: Virginia Stewart, female   DOB: 47 y.o.   MRN: [id number]   HPI Patient presents stating her heels have been awful and she knows that she is getting need some kind of aggressive treatment as they have been getting worse over the last 6 months.  Patient states this is been chronic and patient points plantar aspect heel left over right   ROS      Objective:  Physical Exam  Neurovascular status intact with chronic plantar fasciitis bilateral heel     Assessment:  Chronic plantar fasciitis bilateral with severe pain     Plan:  H&P and condition reviewed at great length.  At this point I have recommended shockwave therapy and she is going to pursue this for the left and I dispensed a air fracture walker to try to reduce the inflammation and to use after shockwave therapy is accomplished.  Due to her intense discomfort I did inject the plantar fascial bilateral today 3 mg Kenalog 5 mg Xylocaine to try to reduce the inflammation and she will have the left plantar fascia worked on

## 2017-05-22 ENCOUNTER — Ambulatory Visit: Payer: BC Managed Care – PPO | Admitting: Podiatry

## 2017-06-11 ENCOUNTER — Encounter (INDEPENDENT_AMBULATORY_CARE_PROVIDER_SITE_OTHER): Payer: BC Managed Care – PPO

## 2017-06-15 ENCOUNTER — Other Ambulatory Visit: Payer: BC Managed Care – PPO

## 2017-06-29 ENCOUNTER — Ambulatory Visit (INDEPENDENT_AMBULATORY_CARE_PROVIDER_SITE_OTHER): Payer: BC Managed Care – PPO | Admitting: Family Medicine

## 2017-10-01 ENCOUNTER — Ambulatory Visit: Payer: BC Managed Care – PPO | Admitting: Podiatry

## 2017-10-26 ENCOUNTER — Encounter: Payer: Self-pay | Admitting: Sports Medicine

## 2017-10-26 ENCOUNTER — Ambulatory Visit: Payer: BC Managed Care – PPO | Admitting: Sports Medicine

## 2017-10-26 DIAGNOSIS — M79672 Pain in left foot: Secondary | ICD-10-CM

## 2017-10-26 DIAGNOSIS — M79671 Pain in right foot: Secondary | ICD-10-CM

## 2017-10-26 DIAGNOSIS — M722 Plantar fascial fibromatosis: Secondary | ICD-10-CM | POA: Diagnosis not present

## 2017-10-26 MED ORDER — TRIAMCINOLONE ACETONIDE 10 MG/ML IJ SUSP: 10.0000 mg | Freq: Once | INTRAMUSCULAR | Status: AC

## 2017-10-26 NOTE — Progress Notes (Signed)
Subjective: Virginia Stewart is a 47 y.o. female patient presents to office with complaint of moderate heel pain on the left>right. 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 Patient has treated this problem with all conservative care including splint, bracing, icing, stretching, night splint, custom insoles and cam boot with no relief. Denies any other pedal complaints.   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   Patient Active Problem List   Diagnosis Date Noted  . Obesity (BMI 30-39.9) 06/24/2012  . 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 Insulin dependent type 2 diabetes mellitus (HCC) 06/12/2011  . HYPERLIPIDEMIA 07/31/2008    Current Outpatient Medications on File Prior to Visit  Medication Sig Dispense Refill  . B Complex Vitamins (VITAMIN-B COMPLEX) TABS Take by mouth.    Marland Kitchen. BIOTIN PO Take 10,000 mg by mouth daily.     Marland Kitchen. buPROPion (WELLBUTRIN) 100 MG tablet Take 1 tablet (100 mg total) by mouth 3 (three) times daily. 14 tablet 0  . buPROPion (WELLBUTRIN) 100 MG tablet TAKE 1 TABLET BY MOUTH THREE TIMES DAILY 90 tablet 0  . busPIRone (BUSPAR) 15 MG tablet Take 15 mg by mouth.    . calcium citrate-vitamin D (CITRACAL+D) 315-200 MG-UNIT tablet Take by mouth.    . Cholecalciferol (VITAMIN D3) 50000 UNITS CAPS Take 1 capsule by mouth every Monday, Wednesday, and Friday. 30 capsule 0  . citalopram (CELEXA) 20 MG tablet     . IRON PO Take 65 mg by mouth daily.    . methylPREDNIsolone (MEDROL DOSPACK) 4 MG tablet follow package directions 21 tablet 0  . valACYclovir (VALTREX) 500 MG tablet Take by mouth.    . vortioxetine HBr (TRINTELLIX) 10 MG TABS TAKE ONE TABLET BY MOUTH ONCE DAILY     No current facility-administered medications on file prior to visit.     No Known Allergies  Objective: Physical Exam General: The patient is alert and oriented x3 in no acute  distress.  Dermatology: Skin is warm, dry and supple bilateral lower extremities. Nails 1-10 are normal. There is no erythema, edema, no eccymosis, no open lesions present. Integument is otherwise unremarkable.  Vascular: Dorsalis Pedis pulse and Posterior Tibial pulse are 2/4 bilateral. Capillary fill time is immediate to all digits.  Neurological: Grossly intact to light touch with an achilles reflex of +2/5 and a negative Tinel's sign bilateral.  Musculoskeletal: Tenderness to palpation at the medial calcaneal tubercale and through the insertion of the plantar fascia on the left > right foot. No pain with compression of calcaneus bilateral. No pain with tuning fork to calcaneus bilateral. No pain with calf compression bilateral. There is decreased Ankle joint range of motion bilateral. All other joints range of motion within normal limits bilateral. + Pes planus. Strength 5/5 in all groups bilateral.   Gait: CAM boot assisted, Antalgic avoid weight on left>right heel    Assessment and Plan: Problem List Items Addressed This Visit    None    Visit Diagnoses    Plantar fasciitis, bilateral    -  Primary   L>R   Foot pain, bilateral       L>R     -Complete examination performed.  -Xrays reviewed -Discussed with patient in detail the condition of plantar fasciitis, how this occurs and general treatment options. Explained both conservative and surgical treatments.  -After oral consent and aseptic prep, injected a  mixture containing 1 ml of 2% plain lidocaine, 1 ml 0.5% plain marcaine, 0.5 ml of kenalog 10 and 0.5 ml of dexamethasone phosphate into left and right heel. Post-injection care discussed with patient.  -May continue with CAM boot on left and good supportive shoe on right -Continue with stretching, icing, and all other treatments that seem to offer relief -Advised EPAT and patient will discuss with office payment plan -Advised patient to also consider PT or surgery if EPAT fails  to offer relief -Return to office when ready for EPAT or sooner.   Asencion Islam, DPM

## 2018-01-08 ENCOUNTER — Ambulatory Visit (INDEPENDENT_AMBULATORY_CARE_PROVIDER_SITE_OTHER): Payer: BC Managed Care – PPO | Admitting: Podiatry

## 2018-01-08 ENCOUNTER — Ambulatory Visit (INDEPENDENT_AMBULATORY_CARE_PROVIDER_SITE_OTHER): Payer: BC Managed Care – PPO

## 2018-01-08 ENCOUNTER — Encounter: Payer: Self-pay | Admitting: Podiatry

## 2018-01-08 DIAGNOSIS — M722 Plantar fascial fibromatosis: Secondary | ICD-10-CM

## 2018-01-08 MED ORDER — TRIAMCINOLONE ACETONIDE 10 MG/ML IJ SUSP
10.0000 mg | Freq: Once | INTRAMUSCULAR | Status: AC
  Administered 2018-01-08: 10 mg

## 2018-01-08 NOTE — Progress Notes (Signed)
Subjective:   Patient ID: Virginia Stewart, female   DOB: 47 y.o.   MRN: [id number]   HPI Patient presents stating she is getting a lot of pain in the bottom outside of the left heel and its been making it hard for her to walk.  Does not remember injury and is wearing boot which helps but the pain is quite intense when she places it on the ground   ROS      Objective:  Physical Exam  Neurovascular status intact with exquisite discomfort on the plantar lateral aspect of the left heel     Assessment:  Acute plantar lateral left fasciitis     Plan:  Reviewed condition continue boot usage and I did a careful lateral fascial injection 3 mg Kenalog 5 mg Xylocaine and discussed other options if symptoms persist.  She is due for shockwave therapy to be performed but has to wait till January so hopefully her symptoms will be somewhat improved with what I did today

## 2018-03-30 IMAGING — CT CT HEAD W/O CM
3 series · 16 of 47 positions shown, 19 images · non-contrast
Comparison: 11/23/2010 MR and 03/28/2005 CT

CLINICAL DATA: 46-year-old female with acute headache for several
days. No known injury.

EXAM:
CT HEAD WITHOUT CONTRAST
TECHNIQUE: Contiguous axial images were obtained from the base of the skull
through the vertex without intravenous contrast.

[Series 2: head wo · axial · 0.41mm/px · z∈[-189,-44]mm · 10 of 35 slices shown, 13 images]
[im 3/35  brain]
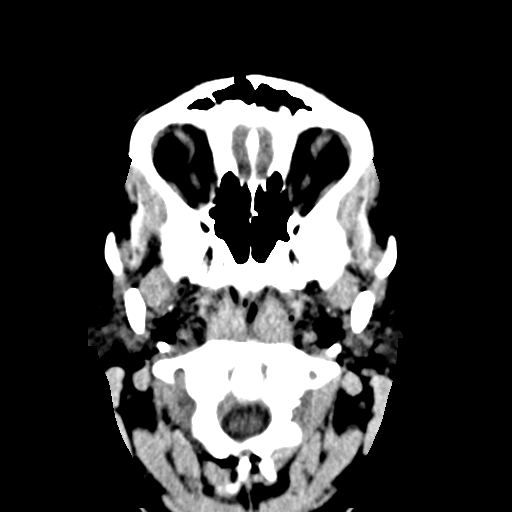
[im 3/35  bone]
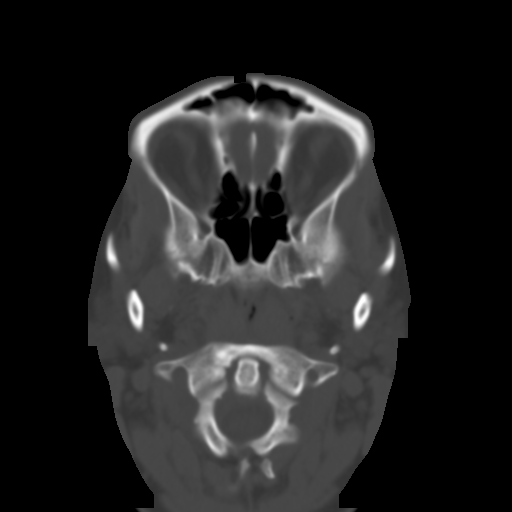
[im 6/35  brain]
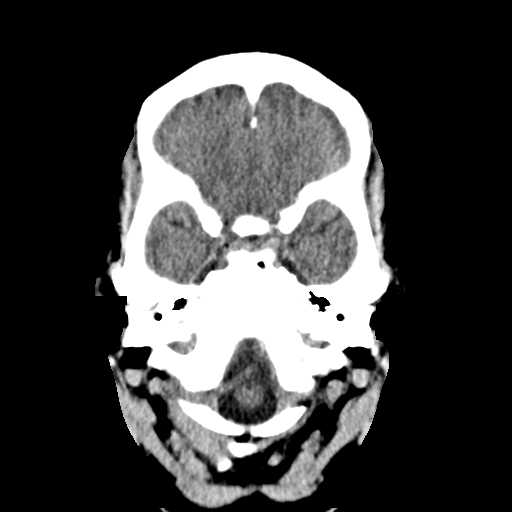
[im 10/35  brain]
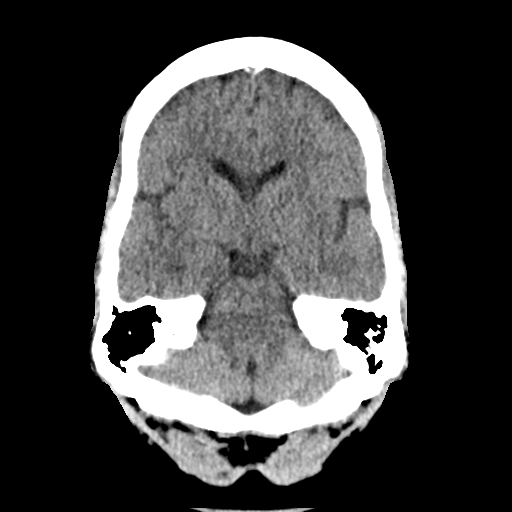
[im 12/35  brain]
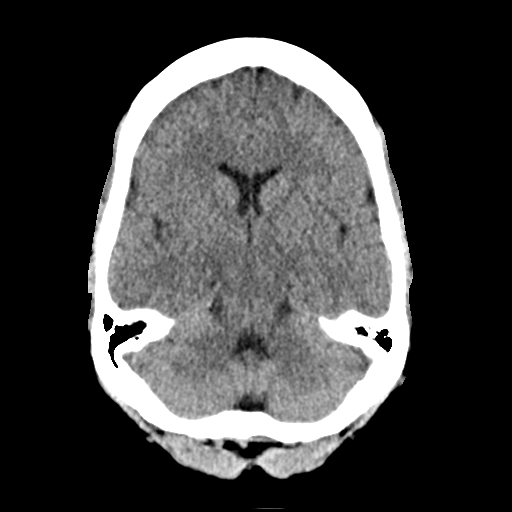
[im 16/35  brain]
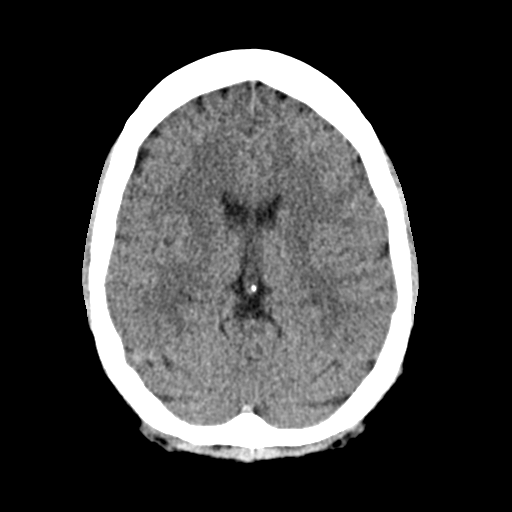
[im 16/35  bone]
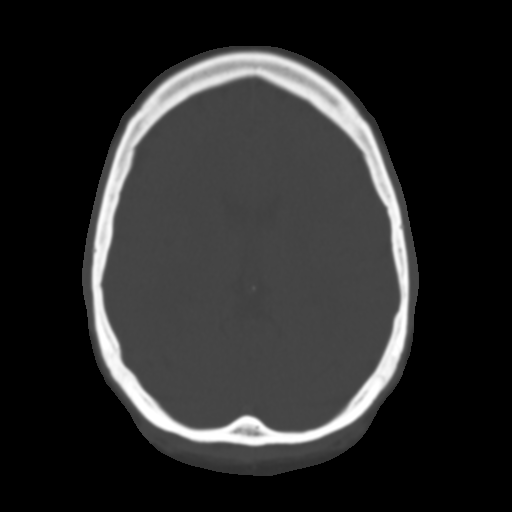
[im 19/35  brain]
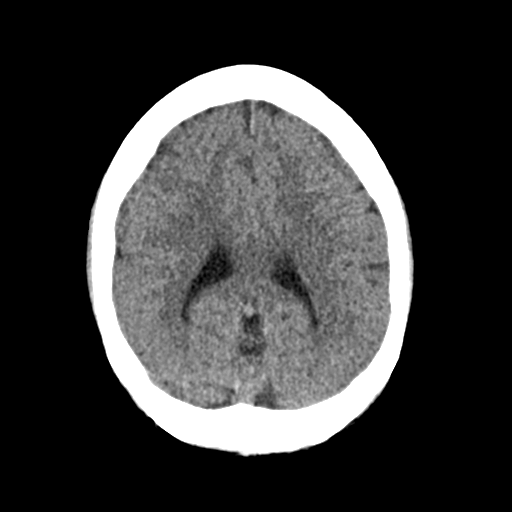
[im 23/35  brain]
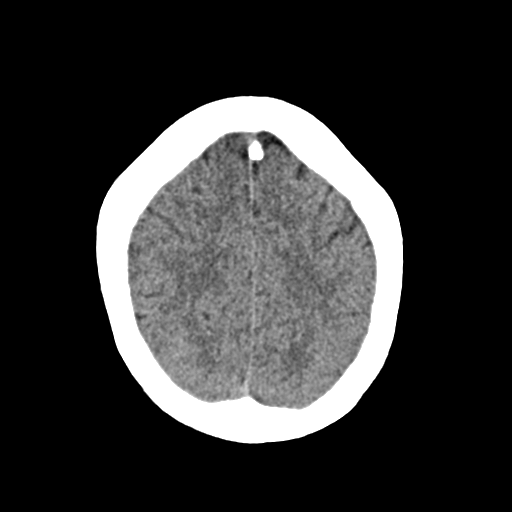
[im 26/35  brain]
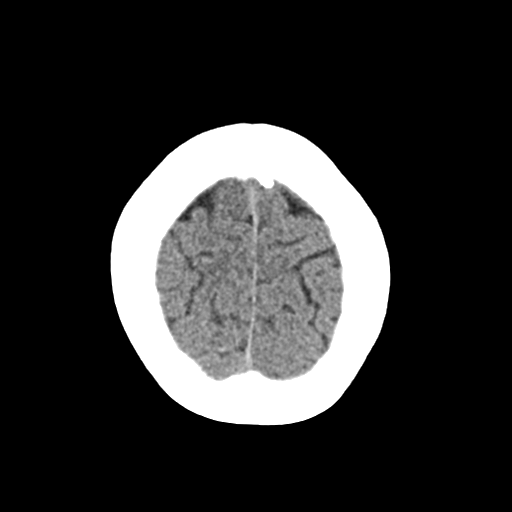
[im 29/35  brain]
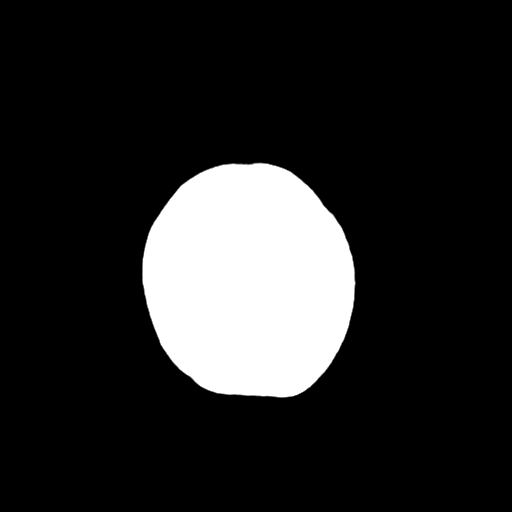
[im 29/35  bone]
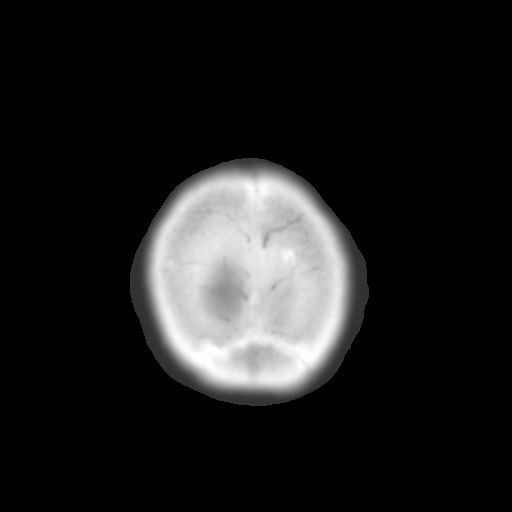
[im 32/35  brain]
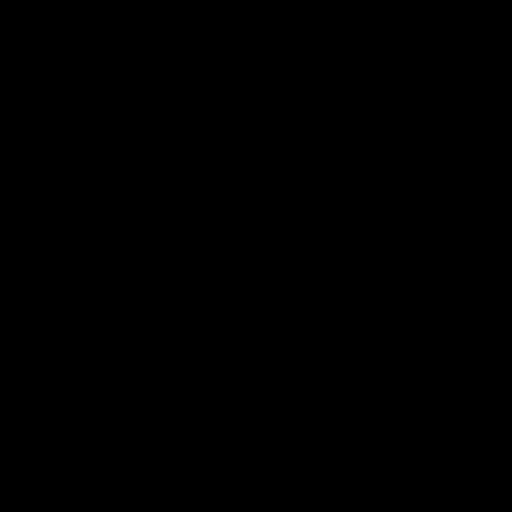

[Series 4: coronal soft · coronal · 0.31mm/px · 3 of 63 slices shown]
[im 21/63  brain]
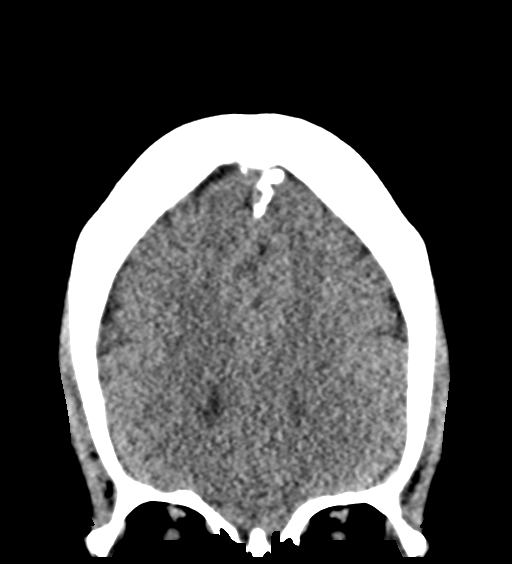
[im 28/63  brain]
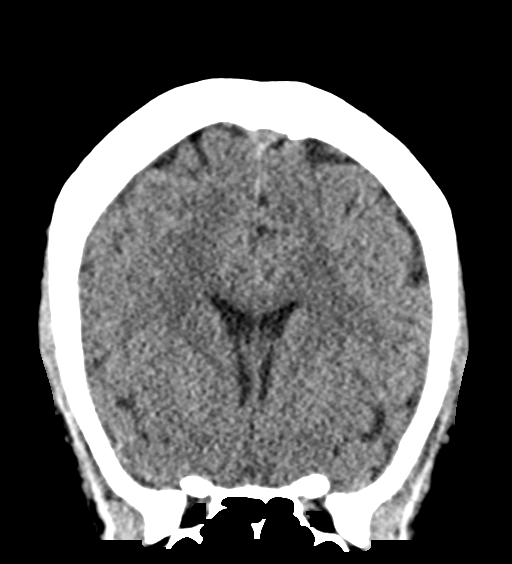
[im 35/63  brain]
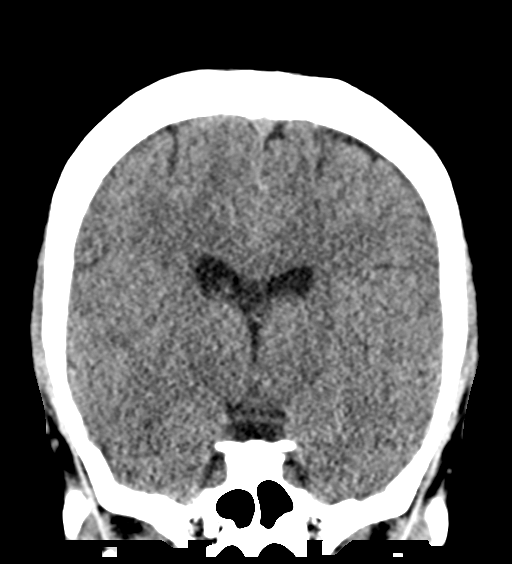

[Series 5: sag soft · sagittal · 0.34mm/px · 3 of 48 slices shown]
[im 16/48  brain]
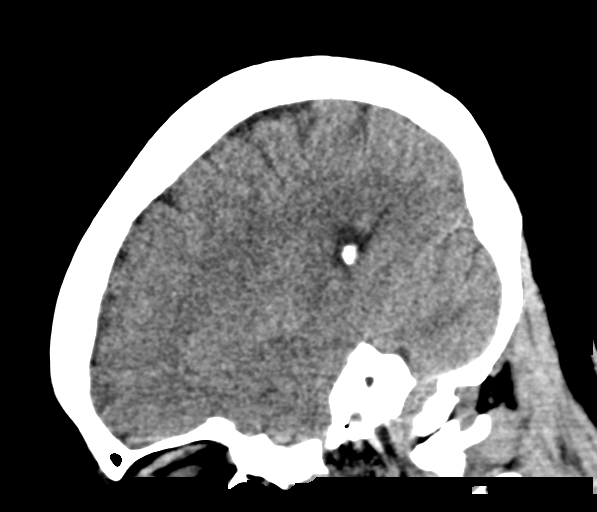
[im 24/48  brain]
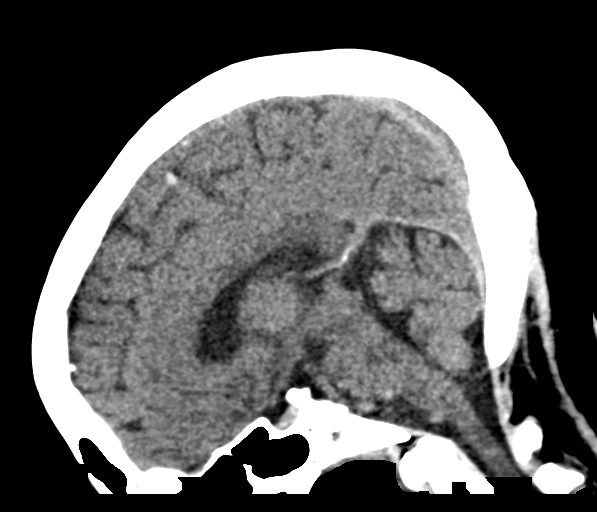
[im 32/48  brain]
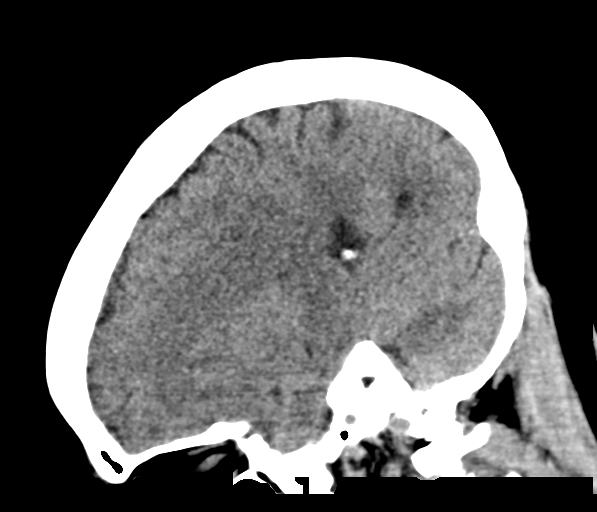

[16 of 47 positions shown; findings below may reference images not displayed]

FINDINGS: Brain: No evidence of acute infarction, hemorrhage, hydrocephalus,
extra-axial collection or mass lesion/mass effect.

Vascular: No hyperdense vessel or unexpected calcification.

Skull: Normal. Negative for fracture or focal lesion.

Sinuses/Orbits: No acute finding.

Other: None.
IMPRESSION: Unremarkable noncontrast head CT.

## 2018-09-17 ENCOUNTER — Encounter: Payer: Self-pay | Admitting: Internal Medicine

## 2018-10-28 DIAGNOSIS — L669 Cicatricial alopecia, unspecified: Secondary | ICD-10-CM | POA: Insufficient documentation

## 2018-10-28 DIAGNOSIS — L678 Other hair color and hair shaft abnormalities: Secondary | ICD-10-CM | POA: Insufficient documentation

## 2019-03-14 ENCOUNTER — Other Ambulatory Visit: Payer: Self-pay | Admitting: Surgery

## 2019-04-05 ENCOUNTER — Other Ambulatory Visit: Payer: Self-pay | Admitting: General Surgery

## 2019-04-05 DIAGNOSIS — Z9884 Bariatric surgery status: Secondary | ICD-10-CM

## 2019-04-27 ENCOUNTER — Ambulatory Visit
Admission: RE | Admit: 2019-04-27 | Discharge: 2019-04-27 | Disposition: A | Discharge status: Home / Self Care | Payer: Medicaid Other | Source: Ambulatory Visit | Attending: General Surgery | Admitting: General Surgery

## 2019-04-27 ENCOUNTER — Other Ambulatory Visit: Payer: Self-pay | Admitting: General Surgery

## 2019-04-27 DIAGNOSIS — Z9884 Bariatric surgery status: Secondary | ICD-10-CM

## 2019-05-21 ENCOUNTER — Ambulatory Visit: Payer: Medicaid Other | Attending: Internal Medicine

## 2019-05-21 DIAGNOSIS — Z23 Encounter for immunization: Secondary | ICD-10-CM | POA: Insufficient documentation

## 2019-05-21 NOTE — Progress Notes (Signed)
   Covid-19 Vaccination Clinic  Name:  KEYONA EMRICH    MRN: [id number] DOB: 03-13-1971  05/21/2019  [name] was observed post Covid-19 immunization for 15 minutes without incidence. She was provided with Vaccine Information Sheet and instruction to access the V-Safe system.   Ms. Mogle was instructed to call 911 with any severe reactions post vaccine: Marland Kitchen Difficulty breathing  . Swelling of your face and throat  . A fast heartbeat  . A bad rash all over your body  . Dizziness and weakness    Immunizations Administered    Name Date Dose VIS Date Route   Pfizer COVID-19 Vaccine 05/21/2019 10:41 AM 0.3 mL 03/04/2019 Intramuscular   Manufacturer: [employer], [employer]   Lot: [id number]   NDC: [address]-9

## 2019-06-11 ENCOUNTER — Ambulatory Visit: Payer: Medicaid Other | Attending: Internal Medicine

## 2019-06-11 DIAGNOSIS — Z23 Encounter for immunization: Secondary | ICD-10-CM

## 2019-06-11 NOTE — Progress Notes (Signed)
   Covid-19 Vaccination Clinic  Name:  Virginia Stewart    MRN: [id number] DOB: Sep 04, 1970  06/11/2019  [name] was observed post Covid-19 immunization for 15 minutes without incident. She was provided with Vaccine Information Sheet and instruction to access the V-Safe system.   [name] was instructed to call 911 with any severe reactions post vaccine: Marland Kitchen Difficulty breathing  . Swelling of face and throat  . A fast heartbeat  . A bad rash all over body  . Dizziness and weakness   Immunizations Administered    Name Date Dose VIS Date Route   Pfizer COVID-19 Vaccine 06/11/2019  9:51 AM 0.3 mL 03/04/2019 Intramuscular   Manufacturer: [employer], [employer]   Lot: [id number]   NDC: [address]-1

## 2019-06-15 ENCOUNTER — Ambulatory Visit: Payer: Medicaid Other

## 2019-10-20 ENCOUNTER — Ambulatory Visit (INDEPENDENT_AMBULATORY_CARE_PROVIDER_SITE_OTHER): Payer: Medicaid Other

## 2019-10-20 ENCOUNTER — Other Ambulatory Visit: Payer: Self-pay

## 2019-10-20 ENCOUNTER — Ambulatory Visit (INDEPENDENT_AMBULATORY_CARE_PROVIDER_SITE_OTHER): Payer: Medicaid Other | Admitting: Podiatry

## 2019-10-20 ENCOUNTER — Encounter: Payer: Self-pay | Admitting: Podiatry

## 2019-10-20 DIAGNOSIS — M722 Plantar fascial fibromatosis: Secondary | ICD-10-CM | POA: Diagnosis not present

## 2019-10-20 NOTE — Patient Instructions (Signed)
Diabetes Mellitus and Foot Care Foot care is an important part of your health, especially when you have diabetes. Diabetes may cause you to have problems because of poor blood flow (circulation) to your feet and legs, which can cause your skin to:  Become thinner and drier.  Break more easily.  Heal more slowly.  Peel and crack. You may also have nerve damage (neuropathy) in your legs and feet, causing decreased feeling in them. This means that you may not notice minor injuries to your feet that could lead to more serious problems. Noticing and addressing any potential problems early is the best way to prevent future foot problems. How to care for your feet Foot hygiene  Wash your feet daily with warm water and mild soap. Do not use hot water. Then, pat your feet and the areas between your toes until they are completely dry. Do not soak your feet as this can dry your skin.  Trim your toenails straight across. Do not dig under them or around the cuticle. File the edges of your nails with an emery board or nail file.  Apply a moisturizing lotion or petroleum jelly to the skin on your feet and to dry, brittle toenails. Use lotion that does not contain alcohol and is unscented. Do not apply lotion between your toes. Shoes and socks  Wear clean socks or stockings every day. Make sure they are not too tight. Do not wear knee-high stockings since they may decrease blood flow to your legs.  Wear shoes that fit properly and have enough cushioning. Always look in your shoes before you put them on to be sure there are no objects inside.  To break in new shoes, wear them for just a few hours a day. This prevents injuries on your feet. Wounds, scrapes, corns, and calluses  Check your feet daily for blisters, cuts, bruises, sores, and redness. If you cannot see the bottom of your feet, use a mirror or ask someone for help.  Do not cut corns or calluses or try to remove them with medicine.  If you  find a minor scrape, cut, or break in the skin on your feet, keep it and the skin around it clean and dry. You may clean these areas with mild soap and water. Do not clean the area with peroxide, alcohol, or iodine.  If you have a wound, scrape, corn, or callus on your foot, look at it several times a day to make sure it is healing and not infected. Check for: ? Redness, swelling, or pain. ? Fluid or blood. ? Warmth. ? Pus or a bad smell. General instructions  Do not cross your legs. This may decrease blood flow to your feet.  Do not use heating pads or hot water bottles on your feet. They may burn your skin. If you have lost feeling in your feet or legs, you may not know this is happening until it is too late.  Protect your feet from hot and cold by wearing shoes, such as at the beach or on hot pavement.  Schedule a complete foot exam at least once a year (annually) or more often if you have foot problems. If you have foot problems, report any cuts, sores, or bruises to your health care provider immediately. Contact a health care provider if:  You have a medical condition that increases your risk of infection and you have any cuts, sores, or bruises on your feet.  You have an injury that is not   healing.  You have redness on your legs or feet.  You feel burning or tingling in your legs or feet.  You have pain or cramps in your legs and feet.  Your legs or feet are numb.  Your feet always feel cold.  You have pain around a toenail. Get help right away if:  You have a wound, scrape, corn, or callus on your foot and: ? You have pain, swelling, or redness that gets worse. ? You have fluid or blood coming from the wound, scrape, corn, or callus. ? Your wound, scrape, corn, or callus feels warm to the touch. ? You have pus or a bad smell coming from the wound, scrape, corn, or callus. ? You have a fever. ? You have a red line going up your leg. Summary  Check your feet every day  for cuts, sores, red spots, swelling, and blisters.  Moisturize feet and legs daily.  Wear shoes that fit properly and have enough cushioning.  If you have foot problems, report any cuts, sores, or bruises to your health care provider immediately.  Schedule a complete foot exam at least once a year (annually) or more often if you have foot problems. This information is not intended to replace advice given to you by your health care provider. Make sure you discuss any questions you have with your health care provider. Document Revised: 12/01/2018 Document Reviewed: 04/11/2016 Elsevier Patient Education  2020 [employer]  

## 2019-10-20 NOTE — Progress Notes (Signed)
Subjective:   Patient ID: Virginia Stewart, female   DOB: 49 y.o.   MRN: [id number]   HPI Patient states that she is starting to work and she is having a flareup in both her heels and it is making it hard for her to bear weight properly   ROS      Objective:  Physical Exam  Neurovascular status intact with inflammation of the plantar fascia bilateral with fluid buildup with patient who takes reasonable care of her diabetes     Assessment:  Acute plantar fasciitis bilateral     Plan:  Sterile prep and injected the plantar fascial bilateral 3 mg 5 mg Xylocaine and instructed on supportive therapy

## 2020-05-15 ENCOUNTER — Other Ambulatory Visit: Payer: Self-pay

## 2020-05-15 ENCOUNTER — Ambulatory Visit (INDEPENDENT_AMBULATORY_CARE_PROVIDER_SITE_OTHER): Payer: Managed Care, Other (non HMO) | Admitting: Plastic Surgery

## 2020-05-15 ENCOUNTER — Encounter: Payer: Self-pay | Admitting: Plastic Surgery

## 2020-05-15 VITALS — BP 114/77 | HR 91 | Ht 67.0 in | Wt 251.0 lb

## 2020-05-15 DIAGNOSIS — E119 Type 2 diabetes mellitus without complications: Secondary | ICD-10-CM | POA: Diagnosis not present

## 2020-05-15 DIAGNOSIS — M546 Pain in thoracic spine: Secondary | ICD-10-CM

## 2020-05-15 DIAGNOSIS — M549 Dorsalgia, unspecified: Secondary | ICD-10-CM | POA: Insufficient documentation

## 2020-05-15 DIAGNOSIS — G8929 Other chronic pain: Secondary | ICD-10-CM

## 2020-05-15 DIAGNOSIS — M793 Panniculitis, unspecified: Secondary | ICD-10-CM | POA: Insufficient documentation

## 2020-05-15 DIAGNOSIS — Z794 Long term (current) use of insulin: Secondary | ICD-10-CM

## 2020-05-15 NOTE — Progress Notes (Signed)
Patient ID: Virginia Stewart, female    DOB: 1970-07-16, 50 y.o.   MRN: [id number]   Chief Complaint  Patient presents with  . Advice Only  . Skin Problem    The patient is a 50 year old black female here for evaluation of her abdomen.  The patient underwent gastric bypass surgery in 2014.  She has been able to sustain her weight.  She was doing really well and her diabetes was well controlled.  In the past several months it has gotten out of control and they are trying to figure out why. Her hemoglobin A1c last month was 9.  She is not a smoker.  She has been to chiropractics in 2018 with some improvement but none recently.  She also had a C-section.  The scar seems to have healed very well.  The patient is 5 feet 7 inches tall and weighs 251 pounds.  She works as an Tax inspector.  She occasionally has skin breakdown and rashes in her abdominal creases but none today.  She complains of back pain.   Review of Systems  Constitutional: Positive for activity change. Negative for appetite change.  HENT: Negative.   Eyes: Negative.   Respiratory: Negative.  Negative for chest tightness and shortness of breath.   Cardiovascular: Negative.  Negative for leg swelling.  Gastrointestinal: Negative for abdominal pain.  Endocrine: Negative.   Genitourinary: Negative.   Musculoskeletal: Positive for back pain.  Hematological: Negative.   Psychiatric/Behavioral: Negative.     Past Medical History:  Diagnosis Date  . Anemia    borderline  . Anginal pain (HCC) 8/12   chest x ray, ekg epic- states was told anxiety attack- none since  . Asthma    with seasonal allergies  . Depression   . Diabetes mellitus 2003  . Headache(784.0)   . Hyperlipidemia   . Hypothyroidism    borderline  . Insulin dependent type 2 diabetes mellitus (HCC)   . Morbid obesity (HCC)     Past Surgical History:  Procedure Laterality Date  . CESAREAN SECTION  04/16/2007  . GASTRIC ROUX-EN-Y  09/08/2011    Procedure: LAPAROSCOPIC ROUX-EN-Y GASTRIC;  Surgeon: Atilano Ina, MD,FACS;  Location: [facility];  Service: General;  Laterality: N/A;      Current Outpatient Medications:  .  ARIPiprazole (ABILIFY) 2 MG tablet, Take by mouth., Disp: , Rfl:  .  B Complex Vitamins (VITAMIN-B COMPLEX) TABS, Take by mouth., Disp: , Rfl:  .  B-D UF III MINI PEN NEEDLES 31G X 5 MM MISC, Inject into the skin daily., Disp: , Rfl:  .  BIOTIN PO, Take 10,000 mg by mouth daily. , Disp: , Rfl:  .  buPROPion (WELLBUTRIN) 100 MG tablet, TAKE 1 TABLET BY MOUTH THREE TIMES DAILY, Disp: 90 tablet, Rfl: 0 .  busPIRone (BUSPAR) 15 MG tablet, Take 15 mg by mouth., Disp: , Rfl:  .  calcium citrate-vitamin D (CITRACAL+D) 315-200 MG-UNIT tablet, Take by mouth., Disp: , Rfl:  .  Cholecalciferol (VITAMIN D3) 50000 UNITS CAPS, Take 1 capsule by mouth every Monday, Wednesday, and Friday., Disp: 30 capsule, Rfl: 0 .  clindamycin (CLEOCIN T) 1 % external solution, Apply topically 3 (three) times a week., Disp: , Rfl:  .  clobetasol ointment (TEMOVATE) 0.05 %, APPLY TO SCALP 3 TO 4 TIMES PER WEEK NOT TO FACE, Disp: , Rfl:  .  cyanocobalamin 1000 MCG tablet, Take by mouth., Disp: , Rfl:  .  glucose blood (PRECISION QID TEST)  test strip, FSBS DAILY, Disp: , Rfl:  .  Insulin Pen Needle (B-D UF III MINI PEN NEEDLES) 31G X 5 MM MISC, Use to administer victoza daily, Disp: , Rfl:  .  IRON PO, Take 65 mg by mouth daily., Disp: , Rfl:  .  Lancets (ONETOUCH ULTRASOFT) lancets, fsbs daily, Disp: , Rfl:  .  methylphenidate (RITALIN) 5 MG tablet, Take by mouth., Disp: , Rfl:  .  valACYclovir (VALTREX) 500 MG tablet, Take by mouth., Disp: , Rfl:  .  vortioxetine HBr (TRINTELLIX) 10 MG TABS, TAKE ONE TABLET BY MOUTH ONCE DAILY, Disp: , Rfl:   Current Facility-Administered Medications:  .  triamcinolone acetonide (KENALOG) 10 MG/ML injection 10 mg, 10 mg, Other, Once, Stover, Titorya, DPM   Objective:   Vitals:   05/15/20 1501  BP: 114/77  Pulse:  91  SpO2: 98%    Physical Exam Vitals and nursing note reviewed.  Constitutional:      Appearance: Normal appearance.  HENT:     Head: Normocephalic and atraumatic.  Cardiovascular:     Rate and Rhythm: Normal rate.     Pulses: Normal pulses.  Pulmonary:     Effort: Pulmonary effort is normal.  Abdominal:     General: Abdomen is flat. There is no distension.     Palpations: There is no mass.     Tenderness: There is no abdominal tenderness.     Hernia: No hernia is present.  Skin:    General: Skin is warm.     Capillary Refill: Capillary refill takes less than 2 seconds.     Coloration: Skin is not jaundiced.     Findings: No bruising.  Neurological:     General: No focal deficit present.     Mental Status: She is alert and oriented to person, place, and time.  Psychiatric:        Mood and Affect: Mood normal.        Behavior: Behavior normal.        Thought Content: Thought content normal.     Assessment & Plan:  Insulin dependent type 2 diabetes mellitus (HCC)  Chronic bilateral thoracic back pain  Panniculitis  Recommend panniculectomy.  I do not recommend surgery until her hemoglobin A1c is 7 or lower.  We will put in for physical therapy.  Patient knows to call us once her hemoglobin A1c is lower and he has gotten through physical therapy. Pictures were obtained of the patient and placed in the chart with the patient's or guardian's permission.   Alena Bills Najwa Spillane, DO

## 2020-07-03 ENCOUNTER — Ambulatory Visit: Payer: Managed Care, Other (non HMO) | Attending: Plastic Surgery

## 2020-07-03 ENCOUNTER — Other Ambulatory Visit: Payer: Self-pay

## 2020-07-03 DIAGNOSIS — M793 Panniculitis, unspecified: Secondary | ICD-10-CM | POA: Diagnosis present

## 2020-07-03 DIAGNOSIS — M2569 Stiffness of other specified joint, not elsewhere classified: Secondary | ICD-10-CM | POA: Insufficient documentation

## 2020-07-03 DIAGNOSIS — M546 Pain in thoracic spine: Secondary | ICD-10-CM | POA: Diagnosis present

## 2020-07-03 DIAGNOSIS — G8929 Other chronic pain: Secondary | ICD-10-CM | POA: Diagnosis present

## 2020-07-03 DIAGNOSIS — R29898 Other symptoms and signs involving the musculoskeletal system: Secondary | ICD-10-CM | POA: Insufficient documentation

## 2020-07-04 NOTE — Therapy (Signed)
[facility] [address] [place], [place], [address] Phone: [phone]   Fax:  [phone]  Physical Therapy Evaluation  Patient Details  Name: Virginia Stewart MRN: [id number] Date of Birth: 12/14/1970 Referring Provider (PT): Peggye Form, DO   Encounter Date: 07/03/2020   PT End of Session - 07/04/20 1131    Visit Number 1    Number of Visits 13    Date for PT Re-Evaluation 08/25/20    Authorization Type C[employer] MANAGED; [place] MEDICAID [employer]    PT Start Time 1135    PT Stop Time 1225    PT Time Calculation (min) 50 min    Activity Tolerance Patient tolerated treatment well    Behavior During Therapy [facility] for tasks assessed/performed           Past Medical History:  Diagnosis Date  . Anemia    borderline  . Anginal pain (HCC) 8/12   chest x ray, ekg epic- states was told anxiety attack- none since  . Asthma    with seasonal allergies  . Depression   . Diabetes mellitus 2003  . Headache(784.0)   . Hyperlipidemia   . Hypothyroidism    borderline  . Insulin dependent type 2 diabetes mellitus (HCC)   . Morbid obesity (HCC)     Past Surgical History:  Procedure Laterality Date  . CESAREAN SECTION  04/16/2007  . GASTRIC ROUX-EN-Y  09/08/2011   Procedure: LAPAROSCOPIC ROUX-EN-Y GASTRIC;  Surgeon: Atilano Ina, MD,FACS;  Location: [facility];  Service: General;  Laterality: N/A;    There were no vitals filed for this visit.    Subjective Assessment - 07/03/20 1148    Subjective Pt reports a 3 year Hx of back L upper shoulder pain which is primarily aggrevated by standing and walking which is part of her job duties as an Geophysicist/field seismologist principal. in 2019 she reports significant loss of weight, but not of her abdomin.    Pertinent History obesity, DM    Limitations Standing;Walking    Patient Stated Goals To decrease my back and neck pain and improve mobility    Currently in Pain? Yes    Pain Score  6    6-10/10   Pain Location Back   and neck   Pain Orientation Upper;Left;Mid;Lower    Pain Descriptors / Indicators Aching;Tightness    Pain Type Chronic pain    Pain Radiating Towards NA    Pain Onset More than a month ago   2019   Aggravating Factors  Prolonged standing, working out    Pain Relieving Factors Bayer- back and body              [facility] PT Assessment - 07/04/20 0001      Assessment   Medical Diagnosis Chronic bilateral thoracic back pain; Panniculitis    Referring Provider (PT) Dillingham, Alena Bills, DO    Onset Date/Surgical Date --   3years   Hand Dominance Right    Prior Therapy No      Precautions   Precautions None      Restrictions   Weight Bearing Restrictions No      Balance Screen   Has the patient fallen in the past 6 months No      Home Environment   Living Environment Private residence    Living Arrangements Spouse/significant other;Children    Type of Home House    Home Access Stairs to enter    Entrance Stairs-Number of Steps 1  Entrance Stairs-Rails None    Home Layout Two level    Alternate Level Stairs-Number of Steps 13    Alternate Level Stairs-Rails Right;Left      Prior Function   Level of Independence Independent    Vocation Full time employment    Vocation Requirements standing, walking, sitting      Cognition   Overall Cognitive Status Within Functional Limits for tasks assessed      Observation/Other Assessments   Focus on Therapeutic Outcomes (FOTO)  NA      Sensation   Light Touch Appears Intact      Posture/Postural Control   Postural Limitations Rounded Shoulders;Forward head;Increased thoracic kyphosis;Increased lumbar lordosis      Deep Tendon Reflexes   DTR Assessment Site Biceps;Brachioradialis;Triceps;Patella;Achilles    Biceps DTR 2+    Brachioradialis DTR 2+    Triceps DTR 2+    Patella DTR 2+    Achilles DTR 2+      ROM / Strength   AROM / PROM / Strength AROM;Strength      AROM   AROM  Assessment Site Lumbar;Cervical    Cervical Flexion WFLs; no inc in pain    Cervical Extension 50d; pinching pain L    Cervical - Right Side Bend 40d; pinch pain L    Cervical - Left Side Bend 40d; pinch pain L    Cervical - Right Rotation 73d; no pain    Cervical - Left Rotation 50d, pulling pain L    Lumbar Flexion 50% limited    Lumbar Extension 75% limited; inc L LBP    Lumbar - Right Side Bend WFLs, inc in LBP, pull    Lumbar - Left Side Bend WFLs; inc in L LBP    Lumbar - Right Rotation WFLs; no inc in LBP    Lumbar - Left Rotation WFLs; no inc. in LBP      Strength   Overall Strength Comments UE and LE myotomal screens were negative      Palpation   Palpation comment TTP of the L mid to low back paraspinals and to the L upper traps and levator      Transfers   Transfers Sit to Stand;Stand to Sit    Sit to Stand 7: Independent      Ambulation/Gait   Ambulation/Gait Yes    Ambulation/Gait Assistance 7: Independent                      Objective measurements completed on examination: See above findings.       [facility] Adult PT Treatment/Exercise - 07/04/20 0001      Exercises   Exercises Neck;Lumbar      Neck Exercises: Seated   Neck Retraction 10 reps   3 sec     Lumbar Exercises: Seated   Other Seated Lumbar Exercises Trunk flexion as tolerated 5 reps; 10 sec      Neck Exercises: Stretches   Upper Trapezius Stretch Right;2 reps;10 seconds    Upper Trapezius Stretch Limitations Pt reports  increase in L upper shulder oain and the ex was stopped                  PT Education - 07/04/20 1146    Education Details Eval findings, POC, HEP. positioning and support for sleeping, use of tennis ball for back parasoinal massage, staggered feet to reduce LBP with prolonged standing    Person(s) Educated Patient    Methods Explanation;Demonstration;Tactile cues;Verbal cues;Handout  Comprehension Verbalized understanding;Returned demonstration;Verbal  cues required;Tactile cues required            PT Short Term Goals - 07/04/20 1347      PT SHORT TERM GOAL #1   Title Pt will be Ind in an initial HEP    Baseline Started oneval    Status New    Target Date 07/25/20      PT SHORT TERM GOAL #2   Title Pt will voice understanding of measures to assist in the reduction of pain    Status New    Target Date 07/25/20             PT Long Term Goals - 07/04/20 1349      PT LONG TERM GOAL #1   Title Pt will be Ind in a final HEP to maintain or progress achieved level of function    Status Revised    Target Date 08/25/20      PT LONG TERM GOAL #2   Title Increase L cervical rotation to 65d for improved function especially with driving    Baseline 32K    Status New    Target Date 08/25/20      PT LONG TERM GOAL #3   Title Improve trunk extension to 25% limitation for improved tolerance to standing and walking    Baseline 75% limitation    Status New    Target Date 08/25/20      PT LONG TERM GOAL #4   Title Pt will be able to demonstrate improve sitting and standing posture to minimize to the neck and low back strain    Status New    Target Date 08/25/20      PT LONG TERM GOAL #5   Title Pt will demonstrate proper body mechanics for household activities to minimize neck and back strain    Status New    Target Date 08/25/20      Additional Long Term Goals   Additional Long Term Goals Yes      PT LONG TERM GOAL #6   Title Pt will report a decrease in L back and neck pain to a range of 3-6/10 with daily activities    Baseline 6-10/10    Status New    Target Date 08/22/20                  Plan - 07/04/20 1136    Clinical Impression Statement Pt presents with a chronic Hx of upper, mid, and low back and cervical pain. Pt relates her pain to her abdomin and is considering a surgical reduction. PT eval revealed pt's back pain was aggrevated by trunk extension and decreased with flexion biased exs. A upper trap  stretch was attempted, but it aggrevated the pt's upper shoulder pain. Pt tolerated cervical retraction to address forward head posture. Pt will benefit from PT 2w6 for trunk flexion biased exs, lumbopelvic strength, cervical mobility and strength, posture and body mechanic education, and the use of modalities and manual techniques to reduce pain and improve function.    Personal Factors and Comorbidities Past/Current Experience;Time since onset of injury/illness/exacerbation    Examination-Activity Limitations Squat;Stand;Stairs;Carry;Lift    Examination-Participation Restrictions Occupation    Stability/Clinical Decision Making Stable/Uncomplicated    Clinical Decision Making Low    Rehab Potential Good    PT Frequency 2x / week    PT Duration 6 weeks    PT Treatment/Interventions ADLs/Self Care Home Management;Cryotherapy;Electrical Stimulation;Moist Heat;Traction;Ultrasound;Therapeutic exercise;Therapeutic activities;Patient/family education;Manual techniques;Taping;Joint Manipulations  PT Next Visit Plan Assess response to HEP, progress ther ex as indicated,, use of manual techniques and modalities as indicated    PT Home Exercise [id number]    Consulted and Agree with Plan of Care Patient           Patient will benefit from skilled therapeutic intervention in order to improve the following deficits and impairments:  Difficulty walking,Obesity,Decreased activity tolerance,Pain,Decreased strength,Postural dysfunction  Visit Diagnosis: Chronic bilateral thoracic back pain  Panniculitis  Decreased ROM of neck  Decreased ROM of trunk and back     Problem List Patient Active Problem List   Diagnosis Date Noted  . Back pain 05/15/2020  . Panniculitis 05/15/2020  . 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 Hair shaft fragility 10/28/2018  . Moderate major depression (HCC) 05/27/2016  . 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 Patellofemoral syndrome of both knees  04/03/2016  . Attention deficit 05/25/2014  . Allergic rhinitis due to dust mite 05/10/2014  . Cough variant asthma 05/10/2014  . Gastric bypass status for obesity 05/10/2014  . History of diabetes mellitus resolved following bariatric surgery 05/10/2014  . Depression 04/13/2014  . Obesity (BMI 30-39.9) 06/24/2012  . 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 Insulin dependent type 2 diabetes mellitus (HCC) 06/12/2011  . HYPERLIPIDEMIA 07/31/2008   Joellyn RuedAllen Rashad Auld MS, PT 07/04/20 3:09 PM  [facility] [address] [place], [place], [address] Phone: [phone]   Fax:  [phone]  Name: Virginia Stewart MRN: [id number] Date of Birth: 1970-08-26

## 2020-07-10 LAB — POCT GLYCOSYLATED HEMOGLOBIN (HGB A1C): Hemoglobin A1C: 6.4 % — AB (ref 4.0–5.6)

## 2020-07-12 ENCOUNTER — Ambulatory Visit: Payer: Managed Care, Other (non HMO)

## 2020-07-17 ENCOUNTER — Other Ambulatory Visit: Payer: Self-pay

## 2020-07-17 ENCOUNTER — Ambulatory Visit: Payer: Managed Care, Other (non HMO)

## 2020-07-17 DIAGNOSIS — R29898 Other symptoms and signs involving the musculoskeletal system: Secondary | ICD-10-CM

## 2020-07-17 DIAGNOSIS — M793 Panniculitis, unspecified: Secondary | ICD-10-CM

## 2020-07-17 DIAGNOSIS — M2569 Stiffness of other specified joint, not elsewhere classified: Secondary | ICD-10-CM

## 2020-07-17 DIAGNOSIS — G8929 Other chronic pain: Secondary | ICD-10-CM

## 2020-07-17 DIAGNOSIS — M546 Pain in thoracic spine: Secondary | ICD-10-CM | POA: Diagnosis not present

## 2020-07-17 NOTE — Therapy (Signed)
[facility] Out[facility] [address] [place], [place], [address] Phone: [phone]   Fax:  [phone]  Physical Therapy Treatment  Patient Details  Name: Virginia Stewart MRN: [id number] Date of Birth: 1970/05/24 Referring Provider (PT): Peggye Form, DO   Encounter Date: 07/17/2020   PT End of Session - 07/17/20 2228    Visit Number 2    Number of Visits 13    Date for PT Re-Evaluation 08/25/20    Authorization Type CIGNA MANAGED; [place] MEDICAID [employer]    PT Start Time 1753    PT Stop Time 1836    PT Time Calculation (min) 43 min    Activity Tolerance Patient tolerated treatment well    Behavior During Therapy [facility] for tasks assessed/performed           Past Medical History:  Diagnosis Date  . Anemia    borderline  . Anginal pain (HCC) 8/12   chest x ray, ekg epic- states was told anxiety attack- none since  . Asthma    with seasonal allergies  . Depression   . Diabetes mellitus 2003  . Headache(784.0)   . Hyperlipidemia   . Hypothyroidism    borderline  . Insulin dependent type 2 diabetes mellitus (HCC)   . Morbid obesity (HCC)     Past Surgical History:  Procedure Laterality Date  . CESAREAN SECTION  04/16/2007  . GASTRIC ROUX-EN-Y  09/08/2011   Procedure: LAPAROSCOPIC ROUX-EN-Y GASTRIC;  Surgeon: Atilano Ina, MD,FACS;  Location: [facility];  Service: General;  Laterality: N/A;    There were no vitals filed for this visit.   Subjective Assessment - 07/17/20 2224    Subjective Pt reports her neck is feeling much better, while her back is feeling about the same. She does report the lumbar strech is helpful in reducing pain.    Patient Stated Goals To decrease my back and neck pain and improve mobility    Currently in Pain? Yes    Pain Score 6     Pain Location Back    Pain Orientation Right;Left;Mid;Lower    Pain Descriptors / Indicators Aching;Tightness    Pain Type Chronic pain    Pain  Onset More than a month ago    Pain Frequency Constant                             [facility] Adult PT Treatment/Exercise - 07/17/20 0001      Exercises   Exercises Neck;Lumbar      Lumbar Exercises: Stretches   Active Hamstring Stretch Right;Left;2 reps;20 seconds    Hip Flexor Stretch Right;Left;2 reps;20 seconds    Hip Flexor Stretch Limitations standing    Other Lumbar Stretch Exercise Seated trunk forward flexion 5x; 10sec      Lumbar Exercises: Supine   Pelvic Tilt 10 reps;3 reps    Pelvic Tilt Limitations UE reach toward ankles    Clam 15 reps    Clam Limitations green Tband    Bent Knee Raise 10 reps   2 sets   Bent Knee Raise Limitations with core engagement    Bridge 15 reps    Bridge Limitations to less than neutral    Other Supine Lumbar Exercises hip add c ball squeezes 51x                  PT Education - 07/17/20 2227    Education Details UPdated HEP. Staggered  standing and use of UE support to decreased low back strain.    Person(s) Educated Patient    Methods Explanation;Demonstration;Tactile cues;Verbal cues;Handout    Comprehension Verbalized understanding;Returned demonstration;Verbal cues required;Tactile cues required            PT Short Term Goals - 07/04/20 1347      PT SHORT TERM GOAL #1   Title Pt will be Ind in an initial HEP    Baseline Started oneval    Status New    Target Date 07/25/20      PT SHORT TERM GOAL #2   Title Pt will voice understanding of measures to assist in the reduction of pain    Status New    Target Date 07/25/20             PT Long Term Goals - 07/04/20 1349      PT LONG TERM GOAL #1   Title Pt will be Ind in a final HEP to maintain or progress achieved level of function    Status Revised    Target Date 08/25/20      PT LONG TERM GOAL #2   Title Increase L cervical rotation to 65d for improved function especially with driving    Baseline 16X    Status New    Target Date 08/25/20       PT LONG TERM GOAL #3   Title Improve trunk extension to 25% limitation for improved tolerance to standing and walking    Baseline 75% limitation    Status New    Target Date 08/25/20      PT LONG TERM GOAL #4   Title Pt will be able to demonstrate improve sitting and standing posture to minimize to the neck and low back strain    Status New    Target Date 08/25/20      PT LONG TERM GOAL #5   Title Pt will demonstrate proper body mechanics for household activities to minimize neck and back strain    Status New    Target Date 08/25/20      Additional Long Term Goals   Additional Long Term Goals Yes      PT LONG TERM GOAL #6   Title Pt will report a decrease in L back and neck pain to a range of 3-6/10 with daily activities    Baseline 6-10/10    Status New    Target Date 08/22/20                 Plan - 07/17/20 2230    Clinical Impression Statement Provided education for standing tips to reduce low back strain and progress HEP. PT was completed for ther ex for lumbopelvic strengthening with emphasis on strengthening the abdominals for support and to minimize low back compression. Pt tolerated the session without adverse effects.    Personal Factors and Comorbidities Past/Current Experience;Time since onset of injury/illness/exacerbation    Examination-Activity Limitations Squat;Stand;Stairs;Carry;Lift    Examination-Participation Restrictions Occupation    Stability/Clinical Decision Making Stable/Uncomplicated    Clinical Decision Making Low    Rehab Potential Good    PT Frequency 2x / week    PT Duration 6 weeks    PT Treatment/Interventions ADLs/Self Care Home Management;Cryotherapy;Electrical Stimulation;Moist Heat;Traction;Ultrasound;Therapeutic exercise;Therapeutic activities;Patient/family education;Manual techniques;Taping;Joint Manipulations    PT Next Visit Plan Assess response to HEP, progress ther ex as indicated,, use of manual techniques and modalities  as indicated    PT Home Exercise Plan [facility]  Consulted and Agree with Plan of Care Patient           Patient will benefit from skilled therapeutic intervention in order to improve the following deficits and impairments:  Difficulty walking,Obesity,Decreased activity tolerance,Pain,Decreased strength,Postural dysfunction  Visit Diagnosis: Chronic bilateral thoracic back pain  Panniculitis  Decreased ROM of trunk and back  Decreased ROM of neck     Problem List Patient Active Problem List   Diagnosis Date Noted  . Back pain 05/15/2020  . Panniculitis 05/15/2020  . 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 Hair shaft fragility 10/28/2018  . Moderate major depression (HCC) 05/27/2016  . 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 Patellofemoral syndrome of both knees 04/03/2016  . Attention deficit 05/25/2014  . Allergic rhinitis due to dust mite 05/10/2014  . Cough variant asthma 05/10/2014  . Gastric bypass status for obesity 05/10/2014  . History of diabetes mellitus resolved following bariatric surgery 05/10/2014  . Depression 04/13/2014  . Obesity (BMI 30-39.9) 06/24/2012  . 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 Insulin dependent type 2 diabetes mellitus (HCC) 06/12/2011  . HYPERLIPIDEMIA 07/31/2008    Joellyn Rued MS, PT 07/17/20 10:37 PM  [facility] [address] [place], [place], [address] Phone: [phone]   Fax:  [phone]  Name: Virginia Stewart MRN: [id number] Date of Birth: 13-Jun-1970

## 2020-07-19 ENCOUNTER — Ambulatory Visit: Payer: Managed Care, Other (non HMO)

## 2020-07-19 ENCOUNTER — Other Ambulatory Visit: Payer: Self-pay

## 2020-07-19 DIAGNOSIS — M546 Pain in thoracic spine: Secondary | ICD-10-CM | POA: Diagnosis not present

## 2020-07-19 DIAGNOSIS — R29898 Other symptoms and signs involving the musculoskeletal system: Secondary | ICD-10-CM

## 2020-07-19 DIAGNOSIS — G8929 Other chronic pain: Secondary | ICD-10-CM

## 2020-07-19 DIAGNOSIS — M793 Panniculitis, unspecified: Secondary | ICD-10-CM

## 2020-07-19 NOTE — Therapy (Signed)
[facility] Outpatient Rehabilitation [facility] [address] [place], [place], [address] Phone: [phone]   Fax:  [phone]  Physical Therapy Treatment  Patient Details  Name: Virginia Stewart MRN: [id number] Date of Birth: 1970/05/17 Referring Provider (PT): Peggye Form, DO   Encounter Date: 07/19/2020   PT End of Session - 07/19/20 1728    Visit Number 3    Number of Visits 13    Date for PT Re-Evaluation 08/25/20    Authorization Type C[employer] MANAGED; [place] MEDICAID [employer]    PT Start Time 1625    PT Stop Time 1710    PT Time Calculation (min) 45 min    Activity Tolerance Patient tolerated treatment well    Behavior During Therapy [facility] for tasks assessed/performed           Past Medical History:  Diagnosis Date  . Anemia    borderline  . Anginal pain (HCC) 8/12   chest x ray, ekg epic- states was told anxiety attack- none since  . Asthma    with seasonal allergies  . Depression   . Diabetes mellitus 2003  . Headache(784.0)   . Hyperlipidemia   . Hypothyroidism    borderline  . Insulin dependent type 2 diabetes mellitus (HCC)   . Morbid obesity (HCC)     Past Surgical History:  Procedure Laterality Date  . CESAREAN SECTION  04/16/2007  . GASTRIC ROUX-EN-Y  09/08/2011   Procedure: LAPAROSCOPIC ROUX-EN-Y GASTRIC;  Surgeon: Atilano Ina, MD,FACS;  Location: [facility];  Service: General;  Laterality: N/A;    There were no vitals filed for this visit.   Subjective Assessment - 07/19/20 1633    Subjective Pt reports she is still having the same dull back ache. When I get up in the AM, my back starts bothering me. Pt reports the HEP does help to decrease pain for short periods.    Patient Stated Goals To decrease my back and neck pain and improve mobility    Currently in Pain? Yes    Pain Score 5     Pain Location Back    Pain Orientation Right;Left;Mid;Lower    Pain Descriptors / Indicators Aching;Tightness    Pain Type  Chronic pain    Pain Onset More than a month ago    Pain Frequency Constant    Aggravating Factors  Prolonged standing, working out    Pain Relieving Factors Bayer- back and body                             [facility] Adult PT Treatment/Exercise - 07/19/20 0001      Exercises   Exercises Neck;Lumbar      Lumbar Exercises: Standing   Row Both;10 reps   2 sets   Theraband Level (Row) Level 3 (Green)    Shoulder Extension Both;10 reps    Theraband Level (Shoulder Extension) Level 3 (Green)    Other Standing Lumbar Exercises Hip ext; L and R; 10x2   modified plantargrade   Other Standing Lumbar Exercises Hip abd; L and R; 10x2      Lumbar Exercises: Supine   Pelvic Tilt 3 reps;15 reps    Pelvic Tilt Limitations UE reach toward ankles    Clam 15 reps   2 sets   Clam Limitations green Tband    Bent Knee Raise 10 reps   2 sets   Bent Knee Raise Limitations with core engagement  Bridge 15 reps    Bridge Limitations with core engagement; lift to less than neutral    Other Supine Lumbar Exercises hip add c ball squeezes 15x; 3 sec hold                  PT Education - 07/19/20 1727    Education Details HEP update    Person(s) Educated Patient    Methods Explanation;Demonstration;Tactile cues;Verbal cues;Handout    Comprehension Verbalized understanding;Returned demonstration;Verbal cues required;Tactile cues required            PT Short Term Goals - 07/04/20 1347      PT SHORT TERM GOAL #1   Title Pt will be Ind in an initial HEP    Baseline Started oneval    Status New    Target Date 07/25/20      PT SHORT TERM GOAL #2   Title Pt will voice understanding of measures to assist in the reduction of pain    Status New    Target Date 07/25/20             PT Long Term Goals - 07/04/20 1349      PT LONG TERM GOAL #1   Title Pt will be Ind in a final HEP to maintain or progress achieved level of function    Status Revised    Target Date 08/25/20       PT LONG TERM GOAL #2   Title Increase L cervical rotation to 65d for improved function especially with driving    Baseline 84O    Status New    Target Date 08/25/20      PT LONG TERM GOAL #3   Title Improve trunk extension to 25% limitation for improved tolerance to standing and walking    Baseline 75% limitation    Status New    Target Date 08/25/20      PT LONG TERM GOAL #4   Title Pt will be able to demonstrate improve sitting and standing posture to minimize to the neck and low back strain    Status New    Target Date 08/25/20      PT LONG TERM GOAL #5   Title Pt will demonstrate proper body mechanics for household activities to minimize neck and back strain    Status New    Target Date 08/25/20      Additional Long Term Goals   Additional Long Term Goals Yes      PT LONG TERM GOAL #6   Title Pt will report a decrease in L back and neck pain to a range of 3-6/10 with daily activities    Baseline 6-10/10    Status New    Target Date 08/22/20                 Plan - 07/19/20 1730    Clinical Impression Statement PT was continued for lumbopelvic/LE exs to increase the strength of the pt's abdominals and back to improve anterior pelvic tllt posture and low back stability. Due to the extended time frame of the pt's condition, anticipate a gradual course of progress. Pt tolerated today's PT session without adverse effects.    Personal Factors and Comorbidities Past/Current Experience;Time since onset of injury/illness/exacerbation    Examination-Activity Limitations Squat;Stand;Stairs;Carry;Lift    Examination-Participation Restrictions Occupation    Stability/Clinical Decision Making Stable/Uncomplicated    Clinical Decision Making Low    Rehab Potential Good    PT Frequency 2x / week  PT Duration 6 weeks    PT Treatment/Interventions ADLs/Self Care Home Management;Cryotherapy;Electrical Stimulation;Moist Heat;Traction;Ultrasound;Therapeutic  exercise;Therapeutic activities;Patient/family education;Manual techniques;Taping;Joint Manipulations    PT Next Visit Plan Assess response to HEP, progress ther ex as indicated,, use of manual techniques and modalities as indicated    PT Home Exercise Plan [email or web address]    Consulted and Agree with Plan of Care Patient           Patient will benefit from skilled therapeutic intervention in order to improve the following deficits and impairments:  Difficulty walking,Obesity,Decreased activity tolerance,Pain,Decreased strength,Postural dysfunction  Visit Diagnosis: Chronic bilateral thoracic back pain  Panniculitis  Decreased ROM of neck     Problem List Patient Active Problem List   Diagnosis Date Noted  . Back pain 05/15/2020  . Panniculitis 05/15/2020  . 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 Hair shaft fragility 10/28/2018  . Moderate major depression (HCC) 05/27/2016  . 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 Patellofemoral syndrome of both knees 04/03/2016  . Attention deficit 05/25/2014  . Allergic rhinitis due to dust mite 05/10/2014  . Cough variant asthma 05/10/2014  . Gastric bypass status for obesity 05/10/2014  . History of diabetes mellitus resolved following bariatric surgery 05/10/2014  . Depression 04/13/2014  . Obesity (BMI 30-39.9) 06/24/2012  . 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 Insulin dependent type 2 diabetes mellitus (HCC) 06/12/2011  . HYPERLIPIDEMIA 07/31/2008    Joellyn Rued MS, PT 07/19/20 5:56 PM  [facility] Outpatient Rehabilitation [facility] [address] [place], [place], [address] Phone: [phone]   Fax:  [phone]  Name: NASHLY OLSSON MRN: [id number] Date of Birth: 1970-06-01

## 2020-07-25 ENCOUNTER — Other Ambulatory Visit: Payer: Self-pay

## 2020-07-25 ENCOUNTER — Ambulatory Visit: Payer: Managed Care, Other (non HMO) | Attending: Plastic Surgery

## 2020-07-25 DIAGNOSIS — R29898 Other symptoms and signs involving the musculoskeletal system: Secondary | ICD-10-CM | POA: Diagnosis present

## 2020-07-25 DIAGNOSIS — M793 Panniculitis, unspecified: Secondary | ICD-10-CM | POA: Diagnosis present

## 2020-07-25 DIAGNOSIS — M546 Pain in thoracic spine: Secondary | ICD-10-CM | POA: Insufficient documentation

## 2020-07-25 DIAGNOSIS — G8929 Other chronic pain: Secondary | ICD-10-CM | POA: Diagnosis present

## 2020-07-25 DIAGNOSIS — M2569 Stiffness of other specified joint, not elsewhere classified: Secondary | ICD-10-CM | POA: Insufficient documentation

## 2020-07-26 NOTE — Therapy (Signed)
[facility] Outpatient Rehabilitation [facility] [address] [place], [place], [address] Phone: [phone]   Fax:  [phone]  Physical Therapy Treatment  Patient Details  Name: Virginia Stewart MRN: [id number] Date of Birth: January 17, 1971 Referring Provider (PT): Peggye Form, DO   Encounter Date: 07/25/2020   PT End of Session - 07/26/20 0618    Visit Number 4    Number of Visits 13    Date for PT Re-Evaluation 08/25/20    Authorization Type C[employer] MANAGED; [place] MEDICAID [employer]    PT Start Time 1840    PT Stop Time 1926    PT Time Calculation (min) 46 min    Activity Tolerance Patient tolerated treatment well    Behavior During Therapy [facility] for tasks assessed/performed           Past Medical History:  Diagnosis Date  . Anemia    borderline  . Anginal pain (HCC) 8/12   chest x ray, ekg epic- states was told anxiety attack- none since  . Asthma    with seasonal allergies  . Depression   . Diabetes mellitus 2003  . Headache(784.0)   . Hyperlipidemia   . Hypothyroidism    borderline  . Insulin dependent type 2 diabetes mellitus (HCC)   . Morbid obesity (HCC)     Past Surgical History:  Procedure Laterality Date  . CESAREAN SECTION  04/16/2007  . GASTRIC ROUX-EN-Y  09/08/2011   Procedure: LAPAROSCOPIC ROUX-EN-Y GASTRIC;  Surgeon: Atilano Ina, MD,FACS;  Location: [facility];  Service: General;  Laterality: N/A;    There were no vitals filed for this visit.   Subjective Assessment - 07/25/20 1845    Subjective Pt states she went bowling this past Monday which bothered her back. Her low is back to her baseline pain, but her mid back is still bothering her. Pt reports some improvement is low back pain, having better mornings when she wakes up.The low back pain is still wrose at the end of the day.    Patient Stated Goals To decrease my back and neck pain and improve mobility    Pain Score 5     Pain Location Back    Pain  Orientation Right;Left    Pain Descriptors / Indicators Aching;Tightness    Pain Type Chronic pain    Pain Onset More than a month ago    Pain Frequency Constant    Aggravating Factors  Prolonged standing, working out    Pain Relieving Factors Bayer- back and body                             [facility] Adult PT Treatment/Exercise - 07/26/20 0001      Exercises   Exercises Neck;Lumbar      Lumbar Exercises: Stretches   ITB Stretch Right;Left;2 reps;30 seconds    ITB Stretch Limitations Doorway for mid back stretch as well      Lumbar Exercises: Standing   Wall Slides 10 reps   2 sets   Row Both;10 reps    Theraband Level (Row) Level 3 (Green)    Shoulder Extension Both;10 reps    Theraband Level (Shoulder Extension) Level 3 (Green)    Other Standing Lumbar Exercises Hip/arm ext and hip ext; L and R; 10x each   modified plantargrade     Lumbar Exercises: Supine   Pelvic Tilt 10 reps   3 sec   Clam 15 reps  2 sets   Clam Limitations green Tband    Bent Knee Raise 10 reps   2 sets   Bent Knee Raise Limitations with core engagement    Dead Bug 5 reps;5 seconds    Dead Bug Limitations bracing ; LEs only    Bridge 15 reps    Bridge Limitations with core engagement; lift to less than neutral    Other Supine Lumbar Exercises hip add c ball squeezes 15x; 3 sec hold      Lumbar Exercises: Sidelying   Other Sidelying Lumbar Exercises open book L and R UE; L 5x; 10 sec; L was comfortable while the R aggrevated and was DCed after 3 reps                  PT Education - 07/26/20 0617    Education Details HEP update: ITB/mid back doorwat stretch    Person(s) Educated Patient    Methods Explanation;Demonstration;Tactile cues;Verbal cues    Comprehension Verbalized understanding;Returned demonstration;Verbal cues required;Tactile cues required            PT Short Term Goals - 07/26/20 0620      PT SHORT TERM GOAL #1   Title Pt will be Ind in an initial HEP     Baseline Started oneval    Status Achieved    Target Date 07/25/20      PT SHORT TERM GOAL #2   Title Pt will voice understanding of measures to assist in the reduction of pain    Status Achieved    Target Date 07/25/20             PT Long Term Goals - 07/04/20 1349      PT LONG TERM GOAL #1   Title Pt will be Ind in a final HEP to maintain or progress achieved level of function    Status Revised    Target Date 08/25/20      PT LONG TERM GOAL #2   Title Increase L cervical rotation to 65d for improved function especially with driving    Baseline 92E    Status New    Target Date 08/25/20      PT LONG TERM GOAL #3   Title Improve trunk extension to 25% limitation for improved tolerance to standing and walking    Baseline 75% limitation    Status New    Target Date 08/25/20      PT LONG TERM GOAL #4   Title Pt will be able to demonstrate improve sitting and standing posture to minimize to the neck and low back strain    Status New    Target Date 08/25/20      PT LONG TERM GOAL #5   Title Pt will demonstrate proper body mechanics for household activities to minimize neck and back strain    Status New    Target Date 08/25/20      Additional Long Term Goals   Additional Long Term Goals Yes      PT LONG TERM GOAL #6   Title Pt will report a decrease in L back and neck pain to a range of 3-6/10 with daily activities    Baseline 6-10/10    Status New    Target Date 08/22/20                 Plan - 07/26/20 2683    Clinical Impression Statement Following bowling earlier this week, the pt is experiencing mid back pain which is  improving. A stretch was proved to assist with muscle tightness and pain in the mid back area. PT was completed to continue to address an anterior pelvic tilt/increased lumbar lordotic posture and low back pain through lumbopelvic flexibility/ROM and strengthening/stability ther ex.Per pt's report she is not waking up in the AM with as much  pain, but her low back pain increases during the course of the day.    Personal Factors and Comorbidities Past/Current Experience;Time since onset of injury/illness/exacerbation    Examination-Activity Limitations Squat;Stand;Stairs;Carry;Lift    Examination-Participation Restrictions Occupation    Stability/Clinical Decision Making Stable/Uncomplicated    Clinical Decision Making Low    Rehab Potential Good    PT Frequency 2x / week    PT Duration 6 weeks    PT Treatment/Interventions ADLs/Self Care Home Management;Cryotherapy;Electrical Stimulation;Moist Heat;Traction;Ultrasound;Therapeutic exercise;Therapeutic activities;Patient/family education;Manual techniques;Taping;Joint Manipulations    PT Next Visit Plan Assess response to HEP, progress ther ex as indicated,, use of manual techniques and modalities as indicated    PT Home Exercise Plan [id number]    Consulted and Agree with Plan of Care Patient           Patient will benefit from skilled therapeutic intervention in order to improve the following deficits and impairments:  Difficulty walking,Obesity,Decreased activity tolerance,Pain,Decreased strength,Postural dysfunction  Visit Diagnosis: Chronic bilateral thoracic back pain  Panniculitis  Decreased ROM of neck  Decreased ROM of trunk and back     Problem List Patient Active Problem List   Diagnosis Date Noted  . Back pain 05/15/2020  . Panniculitis 05/15/2020  . 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 Hair shaft fragility 10/28/2018  . Moderate major depression (HCC) 05/27/2016  . 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 Patellofemoral syndrome of both knees 04/03/2016  . Attention deficit 05/25/2014  . Allergic rhinitis due to dust mite 05/10/2014  . Cough variant asthma 05/10/2014  . Gastric bypass status for obesity 05/10/2014  . History of diabetes mellitus resolved following bariatric surgery 05/10/2014  . Depression 04/13/2014  . Obesity (BMI  30-39.9) 06/24/2012  . 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 Insulin dependent type 2 diabetes mellitus (HCC) 06/12/2011  . HYPERLIPIDEMIA 07/31/2008    Joellyn Rued MS, PT 07/26/20 6:33 AM  [facility] Outpatient Rehabilitation [facility] [address] [place], [place], [address] Phone: [phone]   Fax:  [phone]  Name: Virginia Stewart MRN: [id number] Date of Birth: 1970-05-10

## 2020-07-31 ENCOUNTER — Ambulatory Visit: Payer: Managed Care, Other (non HMO)

## 2020-07-31 ENCOUNTER — Other Ambulatory Visit: Payer: Self-pay

## 2020-07-31 DIAGNOSIS — G8929 Other chronic pain: Secondary | ICD-10-CM

## 2020-07-31 DIAGNOSIS — M2569 Stiffness of other specified joint, not elsewhere classified: Secondary | ICD-10-CM

## 2020-07-31 DIAGNOSIS — M793 Panniculitis, unspecified: Secondary | ICD-10-CM

## 2020-07-31 DIAGNOSIS — R29898 Other symptoms and signs involving the musculoskeletal system: Secondary | ICD-10-CM

## 2020-07-31 DIAGNOSIS — M546 Pain in thoracic spine: Secondary | ICD-10-CM

## 2020-07-31 NOTE — Therapy (Signed)
[facility] Outpatient Rehabilitation [facility] [address] [place], [place], [address] Phone: [phone]   Fax:  [phone]  Physical Therapy Treatment  Patient Details  Name: Virginia Stewart MRN: [id number] Date of Birth: October 14, 1970 Referring Provider (PT): Ulice Bold Alena Bills, DO   Encounter Date: 07/31/2020   PT End of Session - 07/31/20 1747    Visit Number 5    Number of Visits 13    Date for PT Re-Evaluation 08/25/20    Authorization Type C[employer] MANAGED; [place] MEDICAID [employer]    PT Start Time 1747    PT Stop Time 1831    PT Time Calculation (min) 44 min    Activity Tolerance Patient tolerated treatment well;No increased pain    Behavior During Therapy [facility] for tasks assessed/performed           Past Medical History:  Diagnosis Date  . Anemia    borderline  . Anginal pain (HCC) 8/12   chest x ray, ekg epic- states was told anxiety attack- none since  . Asthma    with seasonal allergies  . Depression   . Diabetes mellitus 2003  . Headache(784.0)   . Hyperlipidemia   . Hypothyroidism    borderline  . Insulin dependent type 2 diabetes mellitus (HCC)   . Morbid obesity (HCC)     Past Surgical History:  Procedure Laterality Date  . CESAREAN SECTION  04/16/2007  . GASTRIC ROUX-EN-Y  09/08/2011   Procedure: LAPAROSCOPIC ROUX-EN-Y GASTRIC;  Surgeon: Atilano Ina, MD,FACS;  Location: [facility];  Service: General;  Laterality: N/A;    There were no vitals filed for this visit.   Subjective Assessment - 07/31/20 1749    Subjective Pt presents to PT with reports of continued mid back pain, although she does report decrease in lower back pain. She has been compliant with her HEP with no adverse effect. Pt is ready to begin PT treatment at this time.    Currently in Pain? Yes    Pain Score 6     Pain Location Back    Pain Orientation Mid   lower back 4/10                            [facility] Adult PT  Treatment/Exercise - 07/31/20 0001      Lumbar Exercises: Aerobic   Nustep lvl 5 UE/LE x 5 min while taking subjective      Lumbar Exercises: Standing   Other Standing Lumbar Exercises rows 2x15 blue tband      Lumbar Exercises: Seated   Sit to Stand 10 reps   x 2 - no UE support   Other Seated Lumbar Exercises seated ball rollouts x 15      Lumbar Exercises: Supine   Pelvic Tilt 10 reps;5 seconds    Bent Knee Raise Limitations table top hold 90/90 2x15 sec    Other Supine Lumbar Exercises PPT 90/90 w/ alternating toe taps x 10      Lumbar Exercises: Prone   Other Prone Lumbar Exercises cat-cow 2x10                    PT Short Term Goals - 07/26/20 0620      PT SHORT TERM GOAL #1   Title Pt will be Ind in an initial HEP    Baseline Started oneval    Status Achieved    Target Date 07/25/20  PT SHORT TERM GOAL #2   Title Pt will voice understanding of measures to assist in the reduction of pain    Status Achieved    Target Date 07/25/20             PT Long Term Goals - 07/04/20 1349      PT LONG TERM GOAL #1   Title Pt will be Ind in a final HEP to maintain or progress achieved level of function    Status Revised    Target Date 08/25/20      PT LONG TERM GOAL #2   Title Increase L cervical rotation to 65d for improved function especially with driving    Baseline 71I    Status New    Target Date 08/25/20      PT LONG TERM GOAL #3   Title Improve trunk extension to 25% limitation for improved tolerance to standing and walking    Baseline 75% limitation    Status New    Target Date 08/25/20      PT LONG TERM GOAL #4   Title Pt will be able to demonstrate improve sitting and standing posture to minimize to the neck and low back strain    Status New    Target Date 08/25/20      PT LONG TERM GOAL #5   Title Pt will demonstrate proper body mechanics for household activities to minimize neck and back strain    Status New    Target Date 08/25/20       Additional Long Term Goals   Additional Long Term Goals Yes      PT LONG TERM GOAL #6   Title Pt will report a decrease in L back and neck pain to a range of 3-6/10 with daily activities    Baseline 6-10/10    Status New    Target Date 08/22/20                 Plan - 07/31/20 1833    Clinical Impression Statement Pt was able to complete prescribed exercises with no adverse effect or increase in pain. She continued to show flexion preference for lumbar spine and responded well to thoracic mobility exercises. Pt is progressing well and should continue to be seen per POC as prescribed.    Personal Factors and Comorbidities Past/Current Experience;Time since onset of injury/illness/exacerbation    Examination-Activity Limitations Squat;Stand;Stairs;Carry;Lift    Examination-Participation Restrictions Occupation    Stability/Clinical Decision Making Stable/Uncomplicated    Rehab Potential Good    PT Frequency 2x / week    PT Duration 6 weeks    PT Treatment/Interventions ADLs/Self Care Home Management;Cryotherapy;Electrical Stimulation;Moist Heat;Traction;Ultrasound;Therapeutic exercise;Therapeutic activities;Patient/family education;Manual techniques;Taping;Joint Manipulations    PT Next Visit Plan Assess response to HEP, progress ther ex as indicated,, use of manual techniques and modalities as indicated    PT Home Exercise Plan [id number]    Consulted and Agree with Plan of Care Patient           Patient will benefit from skilled therapeutic intervention in order to improve the following deficits and impairments:  Difficulty walking,Obesity,Decreased activity tolerance,Pain,Decreased strength,Postural dysfunction  Visit Diagnosis: Chronic bilateral thoracic back pain  Panniculitis  Decreased ROM of neck  Decreased ROM of trunk and back     Problem List Patient Active Problem List   Diagnosis Date Noted  . Back pain 05/15/2020  . Panniculitis 05/15/2020  .  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 Hair shaft fragility 10/28/2018  .  Moderate major depression (HCC) 05/27/2016  . 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 Patellofemoral syndrome of both knees 04/03/2016  . Attention deficit 05/25/2014  . Allergic rhinitis due to dust mite 05/10/2014  . Cough variant asthma 05/10/2014  . Gastric bypass status for obesity 05/10/2014  . History of diabetes mellitus resolved following bariatric surgery 05/10/2014  . Depression 04/13/2014  . Obesity (BMI 30-39.9) 06/24/2012  . 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 Insulin dependent type 2 diabetes mellitus (HCC) 06/12/2011  . HYPERLIPIDEMIA 07/31/2008    Eloy End, PT, DPT 07/31/20 6:36 PM  [facility] Health Outpatient Rehabilitation [facility] [address] [place], [place], [address] Phone: [phone]   Fax:  [phone]  Name: Virginia Stewart MRN: [id number] Date of Birth: [birth year]

## 2020-08-02 ENCOUNTER — Ambulatory Visit: Payer: Managed Care, Other (non HMO)

## 2020-08-02 ENCOUNTER — Other Ambulatory Visit: Payer: Self-pay

## 2020-08-02 DIAGNOSIS — R29898 Other symptoms and signs involving the musculoskeletal system: Secondary | ICD-10-CM

## 2020-08-02 DIAGNOSIS — M546 Pain in thoracic spine: Secondary | ICD-10-CM

## 2020-08-02 DIAGNOSIS — G8929 Other chronic pain: Secondary | ICD-10-CM

## 2020-08-02 DIAGNOSIS — M793 Panniculitis, unspecified: Secondary | ICD-10-CM

## 2020-08-02 DIAGNOSIS — M2569 Stiffness of other specified joint, not elsewhere classified: Secondary | ICD-10-CM

## 2020-08-02 NOTE — Therapy (Addendum)
[place], [place], [address] Phone: [phone]   Fax:  [phone]  Physical Therapy Treatment/Discharge  Patient Details  Name: Virginia Stewart MRN: [id number] Date of Birth: [birth year] Referring Provider (PT): Marla Roe Loel Lofty, DO   Encounter Date: 08/02/2020   PT End of Session - 08/02/20 1747     Visit Number 6    Number of Visits 13    Date for PT Re-Evaluation 08/25/20    Authorization Type [employer] MANAGED; [place] MEDICA[employer]    PT Start Time 3903    PT Stop Time 1825    PT Time Calculation (min) 40 min    Activity Tolerance Patient tolerated treatment well;No increased pain    Behavior During Therapy [facility] for tasks assessed/performed             Past Medical History:  Diagnosis Date   Anemia    borderline   Anginal pain ([place]) 8/12   chest x ray, ekg epic- states was told anxiety attack- none since   Asthma    with seasonal allergies   Depression    Diabetes mellitus 2003   Headache(784.0)    Hyperlipidemia    Hypothyroidism    borderline   Insulin dependent type 2 diabetes mellitus ([place])    Morbid obesity ([place])     Past Surgical History:  Procedure Laterality Date   CESAREAN SECTION  04/16/2007   GASTRIC ROUX-EN-Y  09/08/2011   Procedure: LAPAROSCOPIC ROUX-EN-Y GASTRIC;  Surgeon: Gayland Curry, MD,FACS;  Location: [facility];  Service: General;  Laterality: N/A;    There were no vitals filed for this visit.   Subjective Assessment - 08/02/20 1747     Subjective Pt presents to PT with no current reports of mid or lower back pain. She has been compliant with her HEP with no adverse effect. Pt is ready to being PT treamtent at this time.    Currently in Pain? No/denies    Pain Score 0-No pain                               [facility] Adult PT Treatment/Exercise - 08/02/20 0001       Exercises   Exercises Knee/Hip      Neck Exercises:  Seated   Other Seated Exercise seated thoracic extension over towel 2x15      Lumbar Exercises: Stretches   Lower Trunk Rotation 5 reps      Lumbar Exercises: Aerobic   Nustep lvl 5 UE/LE x 5 min while taking subjective      Lumbar Exercises: Seated   Sit to Stand 10 reps   x 2   Other Seated Lumbar Exercises seated ball rollouts x 10 green physioball    Other Seated Lumbar Exercises --      Lumbar Exercises: Supine   Pelvic Tilt 10 reps;5 seconds    Pelvic Tilt Limitations supine ppt w/ ball squeeze 2x10 - 3 sec hold    Bent Knee Raise Limitations supine PPT w/ march x 10    Bridge 10 reps   x 2   Other Supine Lumbar Exercises hamstring ball rollout x 15      Knee/Hip Exercises: Standing   Hip Abduction 2 sets;10 reps;Both    Hip Extension 2 sets;10 reps;Both                      PT Short  Term Goals - 07/26/20 0620       PT SHORT TERM GOAL #1   Title Pt will be Ind in an initial HEP    Baseline Started oneval    Status Achieved    Target Date 07/25/20      PT SHORT TERM GOAL #2   Title Pt will voice understanding of measures to assist in the reduction of pain    Status Achieved    Target Date 07/25/20               PT Long Term Goals - 07/04/20 1349       PT LONG TERM GOAL #1   Title Pt will be Ind in a final HEP to maintain or progress achieved level of function    Status Revised    Target Date 08/25/20      PT LONG TERM GOAL #2   Title Increase L cervical rotation to 65d for improved function especially with driving    Baseline 40N    Status New    Target Date 08/25/20      PT LONG TERM GOAL #3   Title Improve trunk extension to 25% limitation for improved tolerance to standing and walking    Baseline 75% limitation    Status New    Target Date 08/25/20      PT LONG TERM GOAL #4   Title Pt will be able to demonstrate improve sitting and standing posture to minimize to the neck and low back strain    Status New    Target Date  08/25/20      PT LONG TERM GOAL #5   Title Pt will demonstrate proper body mechanics for household activities to minimize neck and back strain    Status New    Target Date 08/25/20      Additional Long Term Goals   Additional Long Term Goals Yes      PT LONG TERM GOAL #6   Title Pt will report a decrease in L back and neck pain to a range of 3-6/10 with daily activities    Baseline 6-10/10    Status New    Target Date 08/22/20                   Plan - 08/02/20 1827     Clinical Impression Statement Pt was able to complete prescribed exercises with no adverse effect or increase in pain. She was able to progress to standing proximal hip strenghtening and would continue to benefit from core strengthening and thoracic mobility exercises. Will continue to progress exercises as tolerated per POC as prescribed.    Personal Factors and Comorbidities Past/Current Experience;Time since onset of injury/illness/exacerbation    Examination-Activity Limitations Squat;Stand;Stairs;Carry;Lift    Examination-Participation Restrictions Occupation    Stability/Clinical Decision Making Stable/Uncomplicated    Rehab Potential Good    PT Frequency 2x / week    PT Duration 6 weeks    PT Treatment/Interventions ADLs/Self Care Home Management;Cryotherapy;Electrical Stimulation;Moist Heat;Traction;Ultrasound;Therapeutic exercise;Therapeutic activities;Patient/family education;Manual techniques;Taping;Joint Manipulations    PT Next Visit Plan Assess response to HEP, progress ther ex as indicated,, use of manual techniques and modalities as indicated    PT Home Exercise Plan [id number]    Consulted and Agree with Plan of Care Patient             Patient will benefit from skilled therapeutic intervention in order to improve the following deficits and impairments:  Difficulty walking,Obesity,Decreased activity tolerance,Pain,Decreased strength,Postural dysfunction  Visit Diagnosis: Chronic  bilateral thoracic back pain  Panniculitis  Decreased ROM of neck  Decreased ROM of trunk and back     Problem List Patient Active Problem List   Diagnosis Date Noted   Back pain 05/15/2020   Panniculitis 05/15/2020   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   Hair shaft fragility 10/28/2018   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/27/2016   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   Patellofemoral syndrome of both knees 04/03/2016   Attention deficit 05/25/2014   Allergic rhinitis due to dust mite 05/10/2014   Cough variant asthma 05/10/2014   Gastric bypass status for obesity 05/10/2014   History of diabetes mellitus resolved following bariatric surgery 05/10/2014   Depression 04/13/2014   Obesity (BMI 30-39.9) 06/24/2012   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/2011   Insulin dependent type 2 diabetes mellitus ([place]) 06/12/2011   HYPERLIPIDEMIA 07/31/2008    Ward Chatters, PT, DPT 08/02/20 6:29 PM  [facility] [address] [place], [place], [address] Phone: [phone]   Fax:  [phone]  Name: DONEEN OLLINGER MRN: [id number] Date of Birth: 07-16-1970  PHYSICAL THERAPY DISCHARGE SUMMARY  Visits from Start of Care: 6  Current functional level related to goals / functional outcomes: unknown   Remaining deficits: unknown   Education / Equipment: HEP  Patient agrees to discharge. Patient goals were partially met. Patient is being discharged due to not returning since the last visit.

## 2020-08-07 ENCOUNTER — Ambulatory Visit: Payer: Managed Care, Other (non HMO)

## 2020-08-08 ENCOUNTER — Telehealth: Payer: Self-pay

## 2020-08-08 NOTE — Telephone Encounter (Signed)
PT called and spoke with patient regarding missed visit on 5/17, stating she was voting and forgot about appt. Reminder given for next visit with patient stating she will attend.

## 2020-08-09 ENCOUNTER — Ambulatory Visit: Payer: Managed Care, Other (non HMO)

## 2020-08-14 ENCOUNTER — Ambulatory Visit: Payer: Managed Care, Other (non HMO)

## 2020-08-14 ENCOUNTER — Telehealth (INDEPENDENT_AMBULATORY_CARE_PROVIDER_SITE_OTHER): Payer: Managed Care, Other (non HMO) | Admitting: Plastic Surgery

## 2020-08-14 ENCOUNTER — Encounter: Payer: Self-pay | Admitting: Plastic Surgery

## 2020-08-14 ENCOUNTER — Other Ambulatory Visit: Payer: Self-pay

## 2020-08-14 DIAGNOSIS — Z794 Long term (current) use of insulin: Secondary | ICD-10-CM

## 2020-08-14 DIAGNOSIS — M546 Pain in thoracic spine: Secondary | ICD-10-CM | POA: Diagnosis not present

## 2020-08-14 DIAGNOSIS — E119 Type 2 diabetes mellitus without complications: Secondary | ICD-10-CM | POA: Diagnosis not present

## 2020-08-14 DIAGNOSIS — M793 Panniculitis, unspecified: Secondary | ICD-10-CM

## 2020-08-14 DIAGNOSIS — G8929 Other chronic pain: Secondary | ICD-10-CM

## 2020-08-14 NOTE — Progress Notes (Signed)
   Subjective:    Patient ID: Virginia Stewart, female    DOB: 03-28-1970, 50 y.o.   MRN: [id number]  The patient is a 50 year old female joining me by a care agility virtual visit.  She would like surgical management for her panniculitis.  She had gastric bypass in 2014 and has been able to decrease her weight and maintain it.  Her current weight is 243 pounds.  She is 5 feet 7 inches tall.  She has been doing much better with her diet and it shows in her weight loss from her last visit which was about 10 pounds decreased.  Her hemoglobin A1c has dropped from 9-6.4.  Her previous surgery includes a C-section and a gastric bypass.  She is not a smoker and her diabetes is well controlled.  She is a Geophysicist/field seismologist principal.  She complains of skin breakdown, rashes and infections.  She has 1 more session of physical therapy left after today.  It showed to be beneficial temporarily but the pain comes back in time.  This includes her neck and back area.   Review of Systems     Objective:   Physical Exam     Assessment & Plan:     ICD-10-CM   1. Insulin dependent type 2 diabetes mellitus (HCC)  E11.9    Z79.4   2. Panniculitis  M79.3   3. Chronic bilateral thoracic back pain  M54.6    G89.29      I connected with  Del D McPhail on 08/14/20 by a video enabled telemedicine application and verified that I am speaking with the correct person using two identifiers.  The patient was at work and I was in the office.  I  spent 20 minutes which included talking with her, reviewing her notes, making phone calls on her behalf and setting her up for preauthorization for surgery.    Plan for panniculectomy with possible liposuction if the patient wants to add that.     I discussed the limitations of evaluation and management by telemedicine. The patient expressed understanding and agreed to proceed.

## 2020-08-15 ENCOUNTER — Telehealth: Payer: Self-pay

## 2020-08-15 ENCOUNTER — Encounter: Payer: Self-pay | Admitting: Plastic Surgery

## 2020-08-15 NOTE — Telephone Encounter (Signed)
PT called and attempted to leave voicemail regarding missed visit and attendance policy, however voicemail was full. Last visit will be cancelled and patient will be discharged from therapy.   She has upcoming visit with referring MD to discuss next steps in treatment.  Eloy End, PT, DPT 08/15/20 2:40 PM

## 2020-08-16 ENCOUNTER — Ambulatory Visit: Payer: Managed Care, Other (non HMO)

## 2020-08-27 ENCOUNTER — Encounter: Payer: Self-pay | Admitting: Plastic Surgery

## 2020-11-08 ENCOUNTER — Encounter: Payer: Self-pay | Admitting: Podiatry

## 2020-11-08 ENCOUNTER — Other Ambulatory Visit: Payer: Self-pay

## 2020-11-08 ENCOUNTER — Ambulatory Visit: Payer: Medicaid Other | Admitting: Podiatry

## 2020-11-08 DIAGNOSIS — M722 Plantar fascial fibromatosis: Secondary | ICD-10-CM

## 2020-11-08 MED ORDER — TRIAMCINOLONE ACETONIDE 10 MG/ML IJ SUSP
20.0000 mg | Freq: Once | INTRAMUSCULAR | Status: AC
  Administered 2020-11-08: 20 mg

## 2020-11-08 NOTE — Progress Notes (Signed)
Subjective:   Patient ID: Virginia Stewart, female   DOB: 50 y.o.   MRN: [id number]   HPI Patient presents stating both of her heels have been hurting and its only been the last month and she is very pleased that she is not had pain prior   ROS      Objective:  Physical Exam  Vascular status intact exquisite discomfort plantar fascial band at insertion bilateral heel insertion     Assessment:  Acute Planter fasciitis bilateral     Plan:  Continue shoe gear modifications and stretching exercises sterile prep and injected the plantar fascial bilateral 3 mg Kenalog 5 mg Xylocaine at insertion

## 2020-12-20 NOTE — Progress Notes (Addendum)
Referring Provider Verlon Au, MD [address] [place],  [place] [address]   CC:  Chief Complaint  Patient presents with   Follow-up      Virginia Stewart is an 50 y.o. female.  HPI: Patient is a 50 year old female with PMH of gastric bypass surgery 2014 and insulin-dependent DM who presents to office for consult for panniculectomy.  I reviewed her medical record and her most recent encounter was 08/14/2020, telemedicine with Dr. Ulice Bold.  At that time, her A1c had improved from 9 to 6.4.  Current weight was 243 pounds.  Her original consult was from 05/15/2020.  She complained of periodic skin breakdown and rashes in infra abdominal fold.  Recommendation was made for panniculectomy after A1c improved, completed physical therapy, and ideally lost weight.  Today, patient reports that she completed 6 weeks of physical therapy and has been doing a good job about controlling her diabetes and improving her A1c.  She states that most recent A1c was below 7.  She continues to have skin irritation involving the pannus, treated often with hydrocortisone cream versus oral Diflucan when there is concern for yeast involvement.  She has been seen by her primary care provider, Dr. Leavy Cella, on multiple occasions with regard to her pannus.  Patient notes that her pannus irritation was recently treated and currently controlled.  She denies any recent fevers or changes in her interim health.   Allergies  Allergen Reactions   Other Itching    House Dust Mite    Outpatient Encounter Medications as of 12/25/2020  Medication Sig   ARIPiprazole (ABILIFY) 2 MG tablet Take by mouth.   B Complex Vitamins (VITAMIN-B COMPLEX) TABS Take by mouth.   B-D UF III MINI PEN NEEDLES 31G X 5 MM MISC Inject into the skin daily.   BIOTIN PO Take 10,000 mg by mouth daily.    buPROPion (WELLBUTRIN) 100 MG tablet TAKE 1 TABLET BY MOUTH THREE TIMES DAILY   busPIRone (BUSPAR) 15 MG tablet Take 15 mg  by mouth.   calcium citrate-vitamin D (CITRACAL+D) 315-200 MG-UNIT tablet Take by mouth.   Cholecalciferol (VITAMIN D3) 50000 UNITS CAPS Take 1 capsule by mouth every Monday, Wednesday, and Friday.   clindamycin (CLEOCIN T) 1 % external solution Apply topically 3 (three) times a week.   clobetasol ointment (TEMOVATE) 0.05 % APPLY TO SCALP 3 TO 4 TIMES PER WEEK NOT TO FACE   cyanocobalamin 1000 MCG tablet Take by mouth.   glucose blood (PRECISION QID TEST) test strip FSBS DAILY   Insulin Pen Needle (B-D UF III MINI PEN NEEDLES) 31G X 5 MM MISC Use to administer victoza daily   IRON PO Take 65 mg by mouth daily.   Lancets (ONETOUCH ULTRASOFT) lancets fsbs daily   methylphenidate (RITALIN) 5 MG tablet Take by mouth.   valACYclovir (VALTREX) 500 MG tablet Take by mouth.   vortioxetine HBr (TRINTELLIX) 10 MG TABS TAKE ONE TABLET BY MOUTH ONCE DAILY   Facility-Administered Encounter Medications as of 12/25/2020  Medication   triamcinolone acetonide (KENALOG) 10 MG/ML injection 10 mg     Past Medical History:  Diagnosis Date   Anemia    borderline   Anginal pain (HCC) 8/12   chest x ray, ekg epic- states was told anxiety attack- none since   Asthma    with seasonal allergies   Depression    Diabetes mellitus 2003   Headache(784.0)    Hyperlipidemia    Hypothyroidism  borderline   Insulin dependent type 2 diabetes mellitus (HCC)    Morbid obesity (HCC)     Past Surgical History:  Procedure Laterality Date   CESAREAN SECTION  04/16/2007   GASTRIC ROUX-EN-Y  09/08/2011   Procedure: LAPAROSCOPIC ROUX-EN-Y GASTRIC;  Surgeon: Atilano Ina, MD,FACS;  Location: [facility];  Service: General;  Laterality: N/A;    Family History  Problem Relation Age of Onset   Diabetes Mother    Hypertension Mother    Diabetes Father    Lung cancer Father     Social History   Social History Narrative   Not on file     Review of Systems General: Denies fevers or chills Pulmonary: Denies  difficulty breathing Skin: Endorses intermittent rashes and discomfort involving inferior pannus  Physical Exam Vitals with BMI 12/25/2020 05/15/2020 04/01/2017  Height 5\' 7"  5\' 7"  -  Weight 252 lbs 251 lbs -  BMI 39.46 39.3 -  Systolic - 114 110  Diastolic - 77 71  Pulse - 91 72    General:  No acute distress, nontoxic appearing  Respiratory: No increased work of breathing Neuro: Alert and oriented Psychiatric: Normal mood and affect  Skin: Very mild erythema, but no significant breakdown on exam   Assessment/Plan  Patient's history and exam suggestive of large pannus.  She reports recurrent panniculitis /yeast infections /pannus irritation.  Patient completed PT without improvement.  She currently has Medicaid.  We will resubmit for insurance authorization.  Patient understands that it will likely take a few weeks before we hear back from them with an answer.  We will then reach out to her regarding surgical scheduling or if further information is required.  Her PT notes and family medicine notes are available in EMR.  Patient also understands that surgeries are already being scheduled for 2023 and even if it is approved it may be a few months before we are able to accommodate her.  Given chronicity since last pictures obtained, repeated pictures here today.  Any pictures obtained of the patient and placed in the chart were with the patient's or guardian's permission.   12/25/2020, 10:52 AM

## 2020-12-25 ENCOUNTER — Ambulatory Visit (INDEPENDENT_AMBULATORY_CARE_PROVIDER_SITE_OTHER): Payer: 59 | Admitting: Physician Assistant

## 2020-12-25 ENCOUNTER — Other Ambulatory Visit: Payer: Self-pay

## 2020-12-25 ENCOUNTER — Encounter: Payer: Self-pay | Admitting: Physician Assistant

## 2020-12-25 VITALS — Ht 67.0 in | Wt 252.0 lb

## 2020-12-25 DIAGNOSIS — E65 Localized adiposity: Secondary | ICD-10-CM

## 2022-02-05 ENCOUNTER — Encounter: Payer: Self-pay | Admitting: Podiatry

## 2022-02-05 ENCOUNTER — Ambulatory Visit (INDEPENDENT_AMBULATORY_CARE_PROVIDER_SITE_OTHER): Payer: 59

## 2022-02-05 ENCOUNTER — Ambulatory Visit (INDEPENDENT_AMBULATORY_CARE_PROVIDER_SITE_OTHER): Payer: 59 | Admitting: Podiatry

## 2022-02-05 DIAGNOSIS — M2042 Other hammer toe(s) (acquired), left foot: Secondary | ICD-10-CM

## 2022-02-05 DIAGNOSIS — M722 Plantar fascial fibromatosis: Secondary | ICD-10-CM

## 2022-02-05 DIAGNOSIS — M2041 Other hammer toe(s) (acquired), right foot: Secondary | ICD-10-CM | POA: Diagnosis not present

## 2022-02-06 NOTE — Progress Notes (Signed)
Subjective:   Patient ID: Virginia Stewart, female   DOB: 51 y.o.   MRN: [id number]   HPI Patient presents stating she is getting a lot of problems with the fifth toes on both feet and has chronic lesions underneath both feet that can become painful and make it hard to walk patient also has significant history of inflammation of the plantar fascia.   ROS      Objective:  Physical Exam  Rotation of the fifth digits both feet with very dry feet keratotic lesions present with lesion formation also plantar aspect both feet and mild discomfort in the plantar fascia improved     Assessment:  Digital deformities bilateral consistent with hammertoe deformity along with history of Planter fasciitis and generalized dry skin with thickness     Plan:  Reviewed conditions at great length and x-rays and I do think digital arthroplasty could be done but do not recommend currently as she seems to be doing okay with pedicures trimming wider shoes.  If it were to get worse that will be necessary but it would be somewhat difficult due to her skin structure and I educated her on this I do not recommend current treatment for plantar fascial inflammation

## 2022-02-07 ENCOUNTER — Telehealth: Payer: Self-pay | Admitting: Hematology and Oncology

## 2022-02-07 NOTE — Telephone Encounter (Signed)
Scheduled appointment per 11/17 referral. Patient is aware of appointment date and time. Patient is aware to arrive 15 mins prior to appointment time and to bring updated insurance cards. Patient is aware of location.   

## 2022-02-27 ENCOUNTER — Other Ambulatory Visit: Payer: Self-pay | Admitting: *Deleted

## 2022-02-27 ENCOUNTER — Inpatient Hospital Stay: Payer: BC Managed Care – PPO

## 2022-02-27 ENCOUNTER — Inpatient Hospital Stay: Payer: BC Managed Care – PPO | Attending: Hematology and Oncology | Admitting: Hematology and Oncology

## 2022-02-27 VITALS — BP 142/86 | HR 89 | Temp 97.2°F | Resp 18 | Ht 67.0 in | Wt 250.7 lb

## 2022-02-27 DIAGNOSIS — R5383 Other fatigue: Secondary | ICD-10-CM | POA: Insufficient documentation

## 2022-02-27 DIAGNOSIS — Z9884 Bariatric surgery status: Secondary | ICD-10-CM | POA: Insufficient documentation

## 2022-02-27 DIAGNOSIS — Z801 Family history of malignant neoplasm of trachea, bronchus and lung: Secondary | ICD-10-CM | POA: Diagnosis not present

## 2022-02-27 DIAGNOSIS — D509 Iron deficiency anemia, unspecified: Secondary | ICD-10-CM

## 2022-02-27 DIAGNOSIS — E119 Type 2 diabetes mellitus without complications: Secondary | ICD-10-CM | POA: Diagnosis not present

## 2022-02-27 DIAGNOSIS — Z87891 Personal history of nicotine dependence: Secondary | ICD-10-CM | POA: Insufficient documentation

## 2022-02-27 DIAGNOSIS — D508 Other iron deficiency anemias: Secondary | ICD-10-CM | POA: Insufficient documentation

## 2022-02-27 LAB — CBC WITH DIFFERENTIAL (CANCER CENTER ONLY)
Abs Immature Granulocytes: 0.01 10*3/uL (ref 0.00–0.07)
Basophils Absolute: 0.1 10*3/uL (ref 0.0–0.1)
Basophils Relative: 1 %
Eosinophils Absolute: 0.1 10*3/uL (ref 0.0–0.5)
Eosinophils Relative: 1 %
HCT: 31.4 % — ABNORMAL LOW (ref 36.0–46.0)
Hemoglobin: 9.8 g/dL — ABNORMAL LOW (ref 12.0–15.0)
Immature Granulocytes: 0 %
Lymphocytes Relative: 44 %
Lymphs Abs: 3.3 10*3/uL (ref 0.7–4.0)
MCH: 25.3 pg — ABNORMAL LOW (ref 26.0–34.0)
MCHC: 31.2 g/dL (ref 30.0–36.0)
MCV: 80.9 fL (ref 80.0–100.0)
Monocytes Absolute: 0.5 10*3/uL (ref 0.1–1.0)
Monocytes Relative: 6 %
Neutro Abs: 3.6 10*3/uL (ref 1.7–7.7)
Neutrophils Relative %: 48 %
Platelet Count: 269 10*3/uL (ref 150–400)
RBC: 3.88 MIL/uL (ref 3.87–5.11)
RDW: 17.8 % — ABNORMAL HIGH (ref 11.5–15.5)
WBC Count: 7.6 10*3/uL (ref 4.0–10.5)
nRBC: 0 % (ref 0.0–0.2)

## 2022-02-27 LAB — CMP (CANCER CENTER ONLY)
ALT: 21 U/L (ref 0–44)
AST: 19 U/L (ref 15–41)
Albumin: 4.1 g/dL (ref 3.5–5.0)
Alkaline Phosphatase: 85 U/L (ref 38–126)
Anion gap: 6 (ref 5–15)
BUN: 11 mg/dL (ref 6–20)
CO2: 30 mmol/L (ref 22–32)
Calcium: 9.6 mg/dL (ref 8.9–10.3)
Chloride: 100 mmol/L (ref 98–111)
Creatinine: 0.54 mg/dL (ref 0.44–1.00)
GFR, Estimated: >60 mL/min (ref >60)
Glucose, Bld: 150 mg/dL — ABNORMAL HIGH (ref 70–99)
Potassium: 4.1 mmol/L (ref 3.5–5.1)
Sodium: 136 mmol/L (ref 135–145)
Total Bilirubin: 0.2 mg/dL — ABNORMAL LOW (ref 0.3–1.2)
Total Protein: 7.9 g/dL (ref 6.5–8.1)

## 2022-02-27 NOTE — Progress Notes (Signed)
[facility] CONSULT NOTE  Patient Care Team: Verlon Au, MD as PCP - General (Family Medicine)  CHIEF COMPLAINTS/PURPOSE OF CONSULTATION:  Iron deficiency anemia  HISTORY OF PRESENTING ILLNESS:  Virginia Stewart 51 y.o. female is here because of recent diagnosis of iron deficiency anemia.  She tells me that she has been iron deficient most of her life including her teenage years but usually her iron levels have remained stable with oral iron supplementation periodically over the years.  Over the past 1 year she has noticed that her energy levels have been declining gradually and most recently she has been craving ice chips and on blood work she was noted to have profound iron deficiency.  She has severe fatigue to the point of exhaustion all the time and does not have much energy.  I reviewed her records extensively and collaborated the history with the patient.  SUMMARY OF ONCOLOGIC HISTORY: Oncology History   No history exists.     MEDICAL HISTORY:  Past Medical History:  Diagnosis Date   Anemia    borderline   Anginal pain (HCC) 8/12   chest x ray, ekg epic- states was told anxiety attack- none since   Asthma    with seasonal allergies   Depression    Diabetes mellitus 2003   Headache(784.0)    Hyperlipidemia    Hypothyroidism    borderline   Insulin dependent type 2 diabetes mellitus (HCC)    Morbid obesity (HCC)     SURGICAL HISTORY: Past Surgical History:  Procedure Laterality Date   CESAREAN SECTION  04/16/2007   GASTRIC ROUX-EN-Y  09/08/2011   Procedure: LAPAROSCOPIC ROUX-EN-Y GASTRIC;  Surgeon: Atilano Ina, MD,FACS;  Location: [facility];  Service: General;  Laterality: N/A;    SOCIAL HISTORY: Social History   Socioeconomic History   Marital status: Married    Spouse name: Not on file   Number of children: Not on file   Years of education: Not on file   Highest education level: Not on file  Occupational History   Not on file   Tobacco Use   Smoking status: Former    Types: Cigarettes    Quit date: 06/11/2004    Years since quitting: 17.7   Smokeless tobacco: Never  Substance and Sexual Activity   Alcohol use: Yes    Alcohol/week: 2.0 - 3.0 standard drinks of alcohol    Types: 2 - 3 drink(s) per week    Comment: red wine   Drug use: No   Sexual activity: Not on file  Other Topics Concern   Not on file  Social History Narrative   Not on file   Social Determinants of Health   Financial Resource Strain: Not on file  Food Insecurity: Not on file  Transportation Needs: Not on file  Physical Activity: Not on file  Stress: Not on file  Social Connections: Not on file  Intimate Partner Violence: Not on file    FAMILY HISTORY: Family History  Problem Relation Age of Onset   Diabetes Mother    Hypertension Mother    Diabetes Father    Lung cancer Father     ALLERGIES:  is allergic to other.  MEDICATIONS:  Current Outpatient Medications  Medication Sig Dispense Refill   ARIPiprazole (ABILIFY) 2 MG tablet Take by mouth.     B Complex Vitamins (VITAMIN-B COMPLEX) TABS Take by mouth.     B-D UF III MINI PEN NEEDLES 31G X 5 MM MISC Inject  into the skin daily.     calcium citrate-vitamin D (CITRACAL+D) 315-200 MG-UNIT tablet Take by mouth.     Cholecalciferol (VITAMIN D3) 50000 UNITS CAPS Take 1 capsule by mouth every Monday, Wednesday, and Friday. 30 capsule 0   Ciclopirox 1 % shampoo Wash the scalp with this shampoo once weekly. Leave on for 5-36mins, then rinse off.     clindamycin (CLEOCIN T) 1 % external solution Apply topically 3 (three) times a week.     clobetasol ointment (TEMOVATE) 0.05 % APPLY TO SCALP 3 TO 4 TIMES PER WEEK NOT TO FACE     cyanocobalamin 1000 MCG tablet Take by mouth.     glucose blood (PRECISION QID TEST) test strip FSBS DAILY     Insulin Pen Needle (B-D UF III MINI PEN NEEDLES) 31G X 5 MM MISC Use to administer victoza daily     IRON PO Take 65 mg by mouth daily.      Lancets (ONETOUCH ULTRASOFT) lancets fsbs daily     OZEMPIC, 2 MG/DOSE, 8 MG/3ML SOPN Inject into the skin.     valACYclovir (VALTREX) 500 MG tablet Take by mouth.     vortioxetine HBr (TRINTELLIX) 10 MG TABS TAKE ONE TABLET BY MOUTH ONCE DAILY     Current Facility-Administered Medications  Medication Dose Route Frequency Provider Last Rate Last Admin   triamcinolone acetonide (KENALOG) 10 MG/ML injection 10 mg  10 mg Other Once Asencion Islam, DPM        REVIEW OF SYSTEMS:   Severe fatigue and weakness All other systems were reviewed with the patient and are negative.  PHYSICAL EXAMINATION: ECOG PERFORMANCE STATUS: 1 - Symptomatic but completely ambulatory  Vitals:   02/27/22 1604  BP: (!) 142/86  Pulse: 89  Resp: 18  Temp: (!) 97.2 F (36.2 C)  SpO2: 100%   Filed Weights   02/27/22 1604  Weight: 250 lb 11.2 oz (113.7 kg)    GENERAL:alert, no distress and comfortable    LABORATORY DATA:  I have reviewed the data as listed Lab Results  Component Value Date   WBC 7.6 02/27/2022   HGB 9.8 (L) 02/27/2022   HCT 31.4 (L) 02/27/2022   MCV 80.9 02/27/2022   PLT 269 02/27/2022   Lab Results  Component Value Date   NA 135 04/01/2017   K 3.7 04/01/2017   CL 102 04/01/2017   CO2 27 04/01/2017    RADIOGRAPHIC STUDIES: I have personally reviewed the radiological reports and agreed with the findings in the report.  ASSESSMENT AND PLAN:  Iron deficiency anemia Lab review 12/19/2021: Hemoglobin 9.5, MCV 78.2, RDW 17.1, iron saturation 4%, TIBC 557, ferritin 3, B12 430 History of gastric bypass surgery  Iron deficiency anemia: I discussed with the patient the process of iron absorption. I counseled extensively regarding the different causes of iron deficiency including blood loss and malabsorption. Patient is seeing gastroenterology next month to undergo colonoscopy and endoscopy.  However, the most likely explanation is malabsorption from prior gastric bypass  surgery.   Recommendation: proceed with IV iron infusions  I discussed with the patient that potentially there may be a need for additional IV iron infusions if the iron levels were to remain low in the future. The frequency of need of IV iron would depend on many other factors including the rate of loss and by the degree of absorption.  Return to clinic in 2 months with recheck on iron studies and hemoglobin.   All questions were answered. The patient  knows to call the clinic with any problems, questions or concerns.    Tamsen Meek, MD 02/27/22

## 2022-02-27 NOTE — Assessment & Plan Note (Addendum)
Lab review 12/19/2021: Hemoglobin 9.5, MCV 78.2, RDW 17.1, iron saturation 4%, TIBC 557, ferritin 3, B12 430 History of gastric bypass surgery  Iron deficiency anemia: I discussed with the patient the process of iron absorption. I counseled extensively regarding the different causes of iron deficiency including blood loss and malabsorption. Patient is seeing gastroenterology next month to undergo colonoscopy and endoscopy.  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   Recommendation: proceed with IV iron infusions  I discussed with the patient that potentially there may be a need for additional IV iron infusions if the iron levels were to remain low in the future. The frequency of need of IV iron would depend on many other factors including the rate of loss and by the degree of absorption.  Return to clinic in 2 months with recheck on iron studies and hemoglobin.

## 2022-02-28 ENCOUNTER — Telehealth: Payer: Self-pay | Admitting: Hematology and Oncology

## 2022-02-28 LAB — IRON AND IRON BINDING CAPACITY (CC-WL,HP ONLY)
Iron: 23 ug/dL — ABNORMAL LOW (ref 28–170)
Saturation Ratios: 4 % — ABNORMAL LOW (ref 10.4–31.8)
TIBC: 542 ug/dL — ABNORMAL HIGH (ref 250–450)
UIBC: 519 ug/dL — ABNORMAL HIGH (ref 148–442)

## 2022-02-28 LAB — FERRITIN: Ferritin: 4 ng/mL — ABNORMAL LOW (ref 11–307)

## 2022-02-28 NOTE — Telephone Encounter (Signed)
Scheduled appointment per 12/7 los. Patient is aware. 

## 2022-03-06 ENCOUNTER — Other Ambulatory Visit: Payer: Self-pay | Admitting: Hematology and Oncology

## 2022-03-07 ENCOUNTER — Inpatient Hospital Stay: Payer: BC Managed Care – PPO

## 2022-03-07 VITALS — BP 123/80 | HR 83 | Temp 98.4°F | Resp 18

## 2022-03-07 DIAGNOSIS — D509 Iron deficiency anemia, unspecified: Secondary | ICD-10-CM | POA: Diagnosis not present

## 2022-03-07 MED ORDER — SODIUM CHLORIDE 0.9 % IV SOLN
300.0000 mg | Freq: Once | INTRAVENOUS | Status: AC
  Administered 2022-03-07: 300 mg via INTRAVENOUS
  Filled 2022-03-07: qty 300

## 2022-03-07 MED ORDER — SODIUM CHLORIDE 0.9 % IV SOLN: Freq: Once | INTRAVENOUS | Status: AC

## 2022-03-07 NOTE — Patient Instructions (Signed)

## 2022-03-14 ENCOUNTER — Inpatient Hospital Stay: Payer: BC Managed Care – PPO

## 2022-03-15 ENCOUNTER — Inpatient Hospital Stay: Payer: BC Managed Care – PPO

## 2022-03-15 VITALS — BP 113/73 | HR 78 | Temp 98.0°F | Resp 18

## 2022-03-15 DIAGNOSIS — D509 Iron deficiency anemia, unspecified: Secondary | ICD-10-CM | POA: Diagnosis not present

## 2022-03-15 MED ORDER — SODIUM CHLORIDE 0.9 % IV SOLN: Freq: Once | INTRAVENOUS | Status: AC

## 2022-03-15 MED ORDER — SODIUM CHLORIDE 0.9 % IV SOLN
300.0000 mg | Freq: Once | INTRAVENOUS | Status: AC
  Administered 2022-03-15: 300 mg via INTRAVENOUS
  Filled 2022-03-15: qty 300

## 2022-03-15 NOTE — Patient Instructions (Signed)

## 2022-03-20 ENCOUNTER — Encounter: Payer: Self-pay | Admitting: Hematology and Oncology

## 2022-03-21 ENCOUNTER — Encounter: Payer: Self-pay | Admitting: Hematology and Oncology

## 2022-03-21 ENCOUNTER — Inpatient Hospital Stay: Payer: BC Managed Care – PPO

## 2022-03-21 VITALS — BP 102/52 | HR 90 | Temp 98.1°F | Resp 17

## 2022-03-21 DIAGNOSIS — D509 Iron deficiency anemia, unspecified: Secondary | ICD-10-CM

## 2022-03-21 MED ORDER — SODIUM CHLORIDE 0.9 % IV SOLN: Freq: Once | INTRAVENOUS | Status: AC

## 2022-03-21 MED ORDER — SODIUM CHLORIDE 0.9 % IV SOLN
300.0000 mg | Freq: Once | INTRAVENOUS | Status: AC
  Administered 2022-03-21: 300 mg via INTRAVENOUS
  Filled 2022-03-21: qty 300

## 2022-03-21 NOTE — Progress Notes (Signed)
Patient declined 30 min post obs. VSS and she ambulated upon DC.

## 2022-04-09 ENCOUNTER — Other Ambulatory Visit: Payer: Self-pay | Admitting: Orthopedic Surgery

## 2022-04-09 DIAGNOSIS — M75102 Unspecified rotator cuff tear or rupture of left shoulder, not specified as traumatic: Secondary | ICD-10-CM

## 2022-05-21 ENCOUNTER — Other Ambulatory Visit: Payer: Self-pay | Admitting: *Deleted

## 2022-05-21 DIAGNOSIS — D509 Iron deficiency anemia, unspecified: Secondary | ICD-10-CM

## 2022-05-22 ENCOUNTER — Inpatient Hospital Stay: Payer: BC Managed Care – PPO | Attending: Hematology and Oncology

## 2022-05-22 DIAGNOSIS — D508 Other iron deficiency anemias: Secondary | ICD-10-CM | POA: Diagnosis not present

## 2022-05-22 DIAGNOSIS — D509 Iron deficiency anemia, unspecified: Secondary | ICD-10-CM

## 2022-05-22 LAB — CBC WITH DIFFERENTIAL (CANCER CENTER ONLY)
Abs Immature Granulocytes: 0.01 10*3/uL (ref 0.00–0.07)
Basophils Absolute: 0 10*3/uL (ref 0.0–0.1)
Basophils Relative: 1 %
Eosinophils Absolute: 0.1 10*3/uL (ref 0.0–0.5)
Eosinophils Relative: 2 %
HCT: 34.8 % — ABNORMAL LOW (ref 36.0–46.0)
Hemoglobin: 11.3 g/dL — ABNORMAL LOW (ref 12.0–15.0)
Immature Granulocytes: 0 %
Lymphocytes Relative: 37 %
Lymphs Abs: 2.5 10*3/uL (ref 0.7–4.0)
MCH: 27.4 pg (ref 26.0–34.0)
MCHC: 32.5 g/dL (ref 30.0–36.0)
MCV: 84.5 fL (ref 80.0–100.0)
Monocytes Absolute: 0.3 10*3/uL (ref 0.1–1.0)
Monocytes Relative: 4 %
Neutro Abs: 3.7 10*3/uL (ref 1.7–7.7)
Neutrophils Relative %: 56 %
Platelet Count: 329 10*3/uL (ref 150–400)
RBC: 4.12 MIL/uL (ref 3.87–5.11)
RDW: 16.5 % — ABNORMAL HIGH (ref 11.5–15.5)
WBC Count: 6.6 10*3/uL (ref 4.0–10.5)
nRBC: 0 % (ref 0.0–0.2)

## 2022-05-22 LAB — IRON AND IRON BINDING CAPACITY (CC-WL,HP ONLY)
Iron: 62 ug/dL (ref 28–170)
Saturation Ratios: 14 % (ref 10.4–31.8)
TIBC: 442 ug/dL (ref 250–450)
UIBC: 380 ug/dL

## 2022-05-22 NOTE — Progress Notes (Signed)
HEMATOLOGY-ONCOLOGY TELEPHONE VISIT PROGRESS NOTE  I connected with our patient on 05/26/22 at  2:15 PM EST by telephone and verified that I am speaking with the correct person using two identifiers.  I discussed the limitations, risks, security and privacy concerns of performing an evaluation and management service by telephone and the availability of in person appointments.  I also discussed with the patient that there may be a patient responsible charge related to this service. The patient expressed understanding and agreed to proceed.   History of Present Illness: Virginia Stewart 52 y.o. female is here because of recent diagnosis of iron deficiency anemia.  She tells me that she has been iron deficient most of her life including her teenage years but usually her iron levels have remained stable with oral iron supplementation periodically over the years. She presents to the clinic for a telephone follow-up to discuss lab results.  REVIEW OF SYSTEMS:   Constitutional: Denies fevers, chills or abnormal weight loss All other systems were reviewed with the patient and are negative.  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  Patient is seeing gastroenterology   IV iron: December 2023 Lab review: 05/22/2022: Hemoglobin 11.3, MCV 84.5, iron saturation 14%, ferritin 37  Recommendation: Recheck labs in 3 months and telephone visit after that to discuss results Patient is having abdominoplasty surgery on 06/12/2022.  I discussed with her that 11.3 hemoglobin is adequate for that surgery.  If however the surgeon has any concerns we are happy to talk about it.  Her baseline has always been around 11.5.  So I believe that she is as close to her baseline  as possible.  3 months lab and telephone visit after that to discuss results.  I discussed the assessment and treatment plan with the patient. The patient was provided an opportunity to ask questions and all were answered. The patient agreed with the plan and demonstrated an understanding of the instructions. The patient was advised to call back or seek an in-person evaluation if the symptoms worsen or if the condition fails to improve as anticipated.   I provided 12 minutes of non-face-to-face time during this encounter.  This includes time for charting and coordination of care   Harriette Ohara, MD  I Gardiner Coins am acting as a scribe for Dr.Vinay Gudena  I have reviewed the above documentation for accuracy and completeness, and I agree with the above.

## 2022-05-23 LAB — FERRITIN: Ferritin: 37 ng/mL (ref 11–307)

## 2022-05-26 ENCOUNTER — Inpatient Hospital Stay: Payer: BC Managed Care – PPO | Attending: Hematology and Oncology | Admitting: Hematology and Oncology

## 2022-05-26 DIAGNOSIS — D509 Iron deficiency anemia, unspecified: Secondary | ICD-10-CM

## 2022-05-26 NOTE — Assessment & Plan Note (Signed)
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  Patient is seeing gastroenterology   IV iron: December 2023 Lab review: 05/22/2022: Hemoglobin 11.3, MCV 84.5, iron saturation 14%, ferritin 37  Recommendation: Recheck labs in 3 months and telephone visit after that to discuss results

## 2022-05-27 ENCOUNTER — Telehealth: Payer: Self-pay | Admitting: Hematology and Oncology

## 2022-05-27 NOTE — Telephone Encounter (Signed)
Rescheduled appointment per 3/4 los. Left voicemail.

## 2022-06-10 ENCOUNTER — Encounter: Payer: Self-pay | Admitting: Hematology and Oncology

## 2022-06-20 ENCOUNTER — Other Ambulatory Visit: Payer: Self-pay

## 2022-06-20 ENCOUNTER — Emergency Department (HOSPITAL_BASED_OUTPATIENT_CLINIC_OR_DEPARTMENT_OTHER): Payer: BC Managed Care – PPO

## 2022-06-20 ENCOUNTER — Emergency Department (HOSPITAL_BASED_OUTPATIENT_CLINIC_OR_DEPARTMENT_OTHER)
Admission: EM | Admit: 2022-06-20 | Discharge: 2022-06-20 | Disposition: A | Discharge status: Home / Self Care | Payer: BC Managed Care – PPO | Attending: Emergency Medicine | Admitting: Emergency Medicine

## 2022-06-20 ENCOUNTER — Encounter: Payer: Self-pay | Admitting: Hematology and Oncology

## 2022-06-20 ENCOUNTER — Encounter (HOSPITAL_BASED_OUTPATIENT_CLINIC_OR_DEPARTMENT_OTHER): Payer: Self-pay

## 2022-06-20 DIAGNOSIS — R1031 Right lower quadrant pain: Secondary | ICD-10-CM | POA: Diagnosis present

## 2022-06-20 DIAGNOSIS — Z79899 Other long term (current) drug therapy: Secondary | ICD-10-CM | POA: Insufficient documentation

## 2022-06-20 DIAGNOSIS — Z794 Long term (current) use of insulin: Secondary | ICD-10-CM | POA: Insufficient documentation

## 2022-06-20 DIAGNOSIS — E039 Hypothyroidism, unspecified: Secondary | ICD-10-CM | POA: Diagnosis not present

## 2022-06-20 DIAGNOSIS — R1030 Lower abdominal pain, unspecified: Secondary | ICD-10-CM

## 2022-06-20 DIAGNOSIS — E119 Type 2 diabetes mellitus without complications: Secondary | ICD-10-CM | POA: Insufficient documentation

## 2022-06-20 LAB — CBC WITH DIFFERENTIAL/PLATELET
Abs Immature Granulocytes: 0.02 10*3/uL (ref 0.00–0.07)
Basophils Absolute: 0 10*3/uL (ref 0.0–0.1)
Basophils Relative: 0 %
Eosinophils Absolute: 0.1 10*3/uL (ref 0.0–0.5)
Eosinophils Relative: 2 %
HCT: 23.5 % — ABNORMAL LOW (ref 36.0–46.0)
Hemoglobin: 7.6 g/dL — ABNORMAL LOW (ref 12.0–15.0)
Immature Granulocytes: 0 %
Lymphocytes Relative: 30 %
Lymphs Abs: 2 10*3/uL (ref 0.7–4.0)
MCH: 27.3 pg (ref 26.0–34.0)
MCHC: 32.3 g/dL (ref 30.0–36.0)
MCV: 84.5 fL (ref 80.0–100.0)
Monocytes Absolute: 0.7 10*3/uL (ref 0.1–1.0)
Monocytes Relative: 10 %
Neutro Abs: 3.9 10*3/uL (ref 1.7–7.7)
Neutrophils Relative %: 58 %
Platelets: 290 10*3/uL (ref 150–400)
RBC: 2.78 MIL/uL — ABNORMAL LOW (ref 3.87–5.11)
RDW: 14.6 % (ref 11.5–15.5)
WBC: 6.9 10*3/uL (ref 4.0–10.5)
nRBC: 0 % (ref 0.0–0.2)

## 2022-06-20 LAB — COMPREHENSIVE METABOLIC PANEL
ALT: 55 U/L — ABNORMAL HIGH (ref 0–44)
AST: 58 U/L — ABNORMAL HIGH (ref 15–41)
Albumin: 3.4 g/dL — ABNORMAL LOW (ref 3.5–5.0)
Alkaline Phosphatase: 87 U/L (ref 38–126)
Anion gap: 8 (ref 5–15)
BUN: 9 mg/dL (ref 6–20)
CO2: 28 mmol/L (ref 22–32)
Calcium: 9.1 mg/dL (ref 8.9–10.3)
Chloride: 100 mmol/L (ref 98–111)
Creatinine, Ser: 0.59 mg/dL (ref 0.44–1.00)
GFR, Estimated: >60 mL/min (ref >60)
Glucose, Bld: 108 mg/dL — ABNORMAL HIGH (ref 70–99)
Potassium: 4.4 mmol/L (ref 3.5–5.1)
Sodium: 136 mmol/L (ref 135–145)
Total Bilirubin: 0.6 mg/dL (ref 0.3–1.2)
Total Protein: 6.9 g/dL (ref 6.5–8.1)

## 2022-06-20 LAB — HCG, SERUM, QUALITATIVE: Preg, Serum: NEGATIVE

## 2022-06-20 LAB — LIPASE, BLOOD: Lipase: <10 U/L — ABNORMAL LOW (ref 11–51)

## 2022-06-20 MED ORDER — OXYCODONE-ACETAMINOPHEN 5-325 MG PO TABS: 1.0000 | ORAL_TABLET | Freq: Four times a day (QID) | ORAL | 0 refills | Status: DC | PRN

## 2022-06-20 MED ORDER — SODIUM CHLORIDE 0.9 % IV BOLUS
500.0000 mL | Freq: Once | INTRAVENOUS | Status: AC
  Administered 2022-06-20: 500 mL via INTRAVENOUS

## 2022-06-20 MED ORDER — MORPHINE SULFATE (PF) 4 MG/ML IV SOLN
4.0000 mg | Freq: Once | INTRAVENOUS | Status: AC
  Administered 2022-06-20: 4 mg via INTRAVENOUS
  Filled 2022-06-20: qty 1

## 2022-06-20 MED ORDER — IOHEXOL 300 MG/ML  SOLN
100.0000 mL | Freq: Once | INTRAMUSCULAR | Status: AC | PRN
  Administered 2022-06-20: 80 mL via INTRAVENOUS

## 2022-06-20 NOTE — ED Notes (Signed)
Patient transported to CT 

## 2022-06-20 NOTE — ED Notes (Signed)
Unsuccessful IV attempt, LAC. Pt tolerated well. Medic Merrily Pew will attempt with [place]

## 2022-06-20 NOTE — ED Triage Notes (Signed)
Pt [place] & states that there is now an area on stomach (LUQ) that is "dark w blisters," unsure if area is bruised, necrosed. Contacted surgeon, advised to come to local ED for eval. Denies SHOB, fever, additional "sick" symptoms.   10/10

## 2022-06-20 NOTE — ED Provider Notes (Signed)
[facility] Provider Note   CSN: [id number] Arrival date & time: 06/20/22  1531     History  Chief Complaint  Patient presents with   Abdominal Pain    Virginia Stewart is a 52 y.o. female past with history of anemia, asthma, diabetes, hypothyroidism presents today for evaluation of abdominal pain.  Patient had liposuction and abdominoplasty 4 days ago at a cosmetic facility in [place].  She reports she noticed bruises and a blister on her belly around the umbilical area yesterday.  She called her plastic surgeon and [place] and was told to come to local emergency department for evaluation.  She complains of left and right lower abdominal pain.  She denies any fever, nausea, vomiting, chest pain, shortness of breath.  Denies any constipation, diarrhea, urinary changes, blood in his stool or urine.  States she has MT about 500 cc of fluid through the drainage in 4 days.   Abdominal Pain   Past Medical History:  Diagnosis Date   Anemia    borderline   Anginal pain ([place]) 8/12   chest x ray, ekg epic- states was told anxiety attack- none since   Asthma    with seasonal allergies   Depression    Diabetes mellitus 2003   Headache(784.0)    Hyperlipidemia    Hypothyroidism    borderline   Insulin dependent type 2 diabetes mellitus ([place])    Morbid obesity ([place])    Past Surgical History:  Procedure Laterality Date   CESAREAN SECTION  04/16/2007   GASTRIC ROUX-EN-Y  09/08/2011   Procedure: LAPAROSCOPIC ROUX-EN-Y GASTRIC;  Surgeon: Gayland Curry, MD,FACS;  Location: [facility];  Service: General;  Laterality: N/A;     Home Medications Prior to Admission medications   Medication Sig Start Date End Date Taking? Authorizing Provider  oxyCODONE-acetaminophen (PERCOCET/ROXICET) 5-325 MG tablet Take 1 tablet by mouth every 6 (six) hours as needed for severe pain. 06/20/22  Yes Rex Kras, PA  ARIPiprazole (ABILIFY) 2 MG tablet Take by mouth.  05/30/19   [provider]  B Complex Vitamins (VITAMIN-B COMPLEX) TABS Take by mouth.    [provider]  B-D UF III MINI PEN NEEDLES 31G X 5 MM MISC Inject into the skin daily. 08/02/19   [provider]  calcium citrate-vitamin D (CITRACAL+D) 315-200 MG-UNIT tablet Take by mouth.    [provider]  Cholecalciferol (VITAMIN D3) 50000 UNITS CAPS Take 1 capsule by mouth every Monday, Wednesday, and Friday. 10/13/13   Greer Pickerel, MD  Ciclopirox 1 % shampoo Wash the scalp with this shampoo once weekly. Leave on for 5-63mins, then rinse off. 07/02/20   [provider]  clindamycin (CLEOCIN T) 1 % external solution Apply topically 3 (three) times a week. 05/30/19   [provider]  clobetasol ointment (TEMOVATE) 0.05 % APPLY TO SCALP 3 TO 4 TIMES PER WEEK NOT TO FACE 02/24/19   [provider]  cyanocobalamin 1000 MCG tablet Take by mouth.    [provider]  glucose blood (PRECISION QID TEST) test strip FSBS DAILY 11/08/18   [provider]  Insulin Pen Needle (B-D UF III MINI PEN NEEDLES) 31G X 5 MM MISC Use to administer victoza daily 05/06/19   [provider]  IRON PO Take 65 mg by mouth daily.    [provider]  Lancets Glory Rosebush ULTRASOFT) lancets fsbs daily 03/19/18   [provider]  OZEMPIC, 2 MG/DOSE, 8 MG/3ML SOPN  Inject into the skin. 12/23/21   [provider]  valACYclovir (VALTREX) 500 MG tablet Take by mouth. 01/22/16   [provider]  vortioxetine HBr (TRINTELLIX) 10 MG TABS TAKE ONE TABLET BY MOUTH ONCE DAILY 12/21/15   [provider]      Allergies    Other    Review of Systems   Review of Systems  Gastrointestinal:  Positive for abdominal pain.    Physical Exam Updated Vital Signs BP 117/70   Pulse 98   Temp 98.6 F (37 C)   Resp 18   SpO2 99%  Physical Exam Vitals and nursing note reviewed.  Constitutional:      Appearance: Normal  appearance.  HENT:     Head: Normocephalic and atraumatic.     Mouth/Throat:     Mouth: Mucous membranes are moist.  Eyes:     General: No scleral icterus. Cardiovascular:     Rate and Rhythm: Normal rate and regular rhythm.     Pulses: Normal pulses.     Heart sounds: Normal heart sounds.  Pulmonary:     Effort: Pulmonary effort is normal.     Breath sounds: Normal breath sounds.  Abdominal:     General: Abdomen is flat.     Palpations: Abdomen is soft.     Tenderness: There is no abdominal tenderness.  Musculoskeletal:        General: No deformity.  Skin:    General: Skin is warm.     Findings: No rash.     Comments: Well healing surgical incision wound in the left and right groin.  Large hematoma to the umbilical area with 1 small blister noted.  Neurological:     General: No focal deficit present.     Mental Status: She is alert.  Psychiatric:        Mood and Affect: Mood normal.     ED Results / Procedures / Treatments   Labs (all labs ordered are listed, but only abnormal results are displayed) Labs Reviewed  CBC WITH DIFFERENTIAL/PLATELET - Abnormal; Notable for the following components:      Result Value   RBC 2.78 (*)    Hemoglobin 7.6 (*)    HCT 23.5 (*)    All other components within normal limits  COMPREHENSIVE METABOLIC PANEL - Abnormal; Notable for the following components:   Glucose, Bld 108 (*)    Albumin 3.4 (*)    AST 58 (*)    ALT 55 (*)    All other components within normal limits  LIPASE, BLOOD - Abnormal; Notable for the following components:   Lipase <10 (*)    All other components within normal limits  HCG, SERUM, QUALITATIVE  URINALYSIS, ROUTINE W REFLEX MICROSCOPIC    EKG None  Radiology CT ABDOMEN PELVIS W CONTRAST  Result Date: 06/20/2022 CLINICAL DATA:  Lower abdominal pain status post abdominoplasty EXAM: CT ABDOMEN AND PELVIS WITH CONTRAST TECHNIQUE: Multidetector CT imaging of the abdomen and pelvis was performed using the  standard protocol following bolus administration of intravenous contrast. RADIATION DOSE REDUCTION: This exam was performed according to the departmental dose-optimization program which includes automated exposure control, adjustment of the mA and/or kV according to patient size and/or use of iterative reconstruction technique. CONTRAST:  31mL OMNIPAQUE IOHEXOL 300 MG/ML  SOLN COMPARISON:  None Available. FINDINGS: Lower chest: Dependent bibasilar subsegmental atelectasis. No pericardial or pleural effusion. Hepatobiliary: Distended gallbladder. No biliary ductal dilatation. No hepatic parenchymal abnormalities. Pancreas: Unremarkable. No pancreatic ductal dilatation  or surrounding inflammatory changes. Spleen: Normal in size without focal abnormality. Adrenals/Urinary Tract: Adrenal glands are unremarkable. Kidneys are normal, without renal calculi, focal lesion, or hydronephrosis. Bladder is unremarkable. Stomach/Bowel: Status post gastric bypass. No bowel dilatation to suggest obstruction. There is a normal appendix. No mesenteric inflammatory changes or fluid collections. Vascular/Lymphatic: No significant vascular findings are present. No enlarged abdominal or pelvic lymph nodes. Reproductive: Uterus and bilateral adnexa are unremarkable. Other: Lower anterior abdominal wall postop changes with some skin thickening and subcutaneous edema. No discrete walled-off collection to suggest a drainable abscess. There are surgical drains in place. No free air or fluid in the abdomen or pelvis. Musculoskeletal: No acute or significant osseous findings. IMPRESSION: 1. Postop edematous changes lower anterior abdominal wall without evidence of an abscess. 2. Distended gallbladder. 3. Otherwise no acute abdominal or pelvic pathology identified. Electronically Signed   By: Sammie Bench M.D.   On: 06/20/2022 18:55    Procedures Procedures    Medications Ordered in ED Medications  sodium chloride 0.9 % bolus 500 mL  ( Intravenous Stopped 06/20/22 1712)  morphine (PF) 4 MG/ML injection 4 mg (4 mg Intravenous Given 06/20/22 1647)  iohexol (OMNIPAQUE) 300 MG/ML solution 100 mL (80 mLs Intravenous Contrast Given 06/20/22 1840)    ED Course/ Medical Decision Making/ A&P Clinical Course as of 06/20/22 2031  Fri Jun 20, 2022  1605 Elective surgery earlier this week. Here with a chief complaint of abdominal pain. CT abdomen pelvis [CC]  1926 CT abdomen pelvis grossly reassuring, lab work with no focal pathology [CC]  1926 Marked hemoglobin drop 9.6-7.6.  No evidence of bleeding on her exam.  Likely postoperative shift.  No acute indication for further intervention at this time. [CC]    Clinical Course User Index [CC] Tretha Sciara, MD                             Medical Decision Making Amount and/or Complexity of Data Reviewed Labs: ordered. Radiology: ordered.  Risk Prescription drug management.   This patient presents to the ED for abdominal pain and wound check post surgery, this involves an extensive number of treatment options, and is a complaint that carries with a high risk of complications and morbidity.  The differential diagnosis includes hematoma, surgical complication, appendicitis, diverticulitis, pregnancy, ectopic pregnancy, hernia, UTI, constipation, ovarian torsion, ovarian cyst, PID/TOA, period/fibroid. This is not an exhaustive list.  Lab tests: I ordered and personally interpreted labs.  The pertinent results include: WBC unremarkable. Hbg 7.6. Platelets unremarkable. Electrolytes unremarkable. BUN, creatinine unremarkable.   Imaging studies: I ordered imaging studies. I personally reviewed, interpreted imaging and agree with the radiologist's interpretations. The results include: CT abdomen pelvis showed 1. Postop edematous changes lower anterior abdominal wall without evidence of an abscess. 2. Distended gallbladder. 3. Otherwise no acute abdominal or pelvic pathology  identified.  Problem list/ ED course/ Critical interventions/ Medical management: HPI: See above Vital signs within normal range and stable throughout visit. Laboratory/imaging studies significant for: See above. On physical examination, patient is afebrile and appears in no acute distress.  CT scan was reassuring with postop edematous changes without evidence of an abscess or any acute abdominal or pelvic pathology.  Patient's hemoglobin 7.6 likely due to surgery.  Patient is stable, denies any headache, dizziness, or active bleeding.  Patient has well-healing surgical wound in her lower abdomen, no blood or pus drainage noted.  She will follow-up with her surgeon  next week.  Advised patient to take Tylenol or ibuprofen or Percocet as needed for pain and return to the ER if new or worsening symptoms.  Patient is stable upon discharge.   I have reviewed the patient home medicines and have made adjustments as needed.  Cardiac monitoring/EKG: The patient was maintained on a cardiac monitor.  I personally reviewed and interpreted the cardiac monitor which showed an underlying rhythm of: sinus rhythm.  Additional history obtained: External records from outside source obtained and reviewed including: Chart review including previous notes, labs, imaging.  Disposition Continued outpatient therapy. Follow-up with PCP and plastic surgery recommended for reevaluation of symptoms. Treatment plan discussed with patient.  Pt acknowledged understanding was agreeable to the plan. Worrisome signs and symptoms were discussed with patient, and patient acknowledged understanding to return to the ED if they noticed these signs and symptoms. Patient was stable upon discharge.   This chart was dictated using voice recognition software.  Despite best efforts to proofread,  errors can occur which can change the documentation meaning.          Final Clinical Impression(s) / ED Diagnoses Final diagnoses:  Lower  abdominal pain    Rx / DC Orders ED Discharge Orders          Ordered    oxyCODONE-acetaminophen (PERCOCET/ROXICET) 5-325 MG tablet  Every 6 hours PRN        06/20/22 1944              Rex Kras, PA 06/20/22 2035    Tretha Sciara, MD 06/21/22 [phone]

## 2022-06-20 NOTE — ED Notes (Signed)
Pt reports RT arm pain at IV site. Assess and IV has infiltrated. Fluids d/c and hot pack applied. EDP Marin Comment, PA notified

## 2022-06-20 NOTE — Discharge Instructions (Addendum)
Please take tylenol/ibuprofen or Vicodin as needed for pain. I recommend close follow-up with PCP for reevaluation.  Please do not hesitate to return to emergency department if worrisome signs symptoms we discussed become apparent.

## 2022-07-10 ENCOUNTER — Telehealth: Payer: Self-pay | Admitting: Hematology and Oncology

## 2022-07-10 NOTE — Telephone Encounter (Signed)
Scheduled appointments per staff message. Patient is aware of the made appointments. 

## 2022-07-11 ENCOUNTER — Inpatient Hospital Stay: Payer: BC Managed Care – PPO | Attending: Hematology and Oncology

## 2022-07-11 ENCOUNTER — Other Ambulatory Visit: Payer: Self-pay

## 2022-07-11 DIAGNOSIS — K5900 Constipation, unspecified: Secondary | ICD-10-CM | POA: Diagnosis not present

## 2022-07-11 DIAGNOSIS — D509 Iron deficiency anemia, unspecified: Secondary | ICD-10-CM | POA: Insufficient documentation

## 2022-07-11 LAB — FERRITIN: Ferritin: 51 ng/mL (ref 11–307)

## 2022-07-11 LAB — CBC WITH DIFFERENTIAL (CANCER CENTER ONLY)
Abs Immature Granulocytes: 0.01 10*3/uL (ref 0.00–0.07)
Basophils Absolute: 0 10*3/uL (ref 0.0–0.1)
Basophils Relative: 1 %
Eosinophils Absolute: 0.1 10*3/uL (ref 0.0–0.5)
Eosinophils Relative: 2 %
HCT: 29 % — ABNORMAL LOW (ref 36.0–46.0)
Hemoglobin: 9.5 g/dL — ABNORMAL LOW (ref 12.0–15.0)
Immature Granulocytes: 0 %
Lymphocytes Relative: 34 %
Lymphs Abs: 1.8 10*3/uL (ref 0.7–4.0)
MCH: 28.4 pg (ref 26.0–34.0)
MCHC: 32.8 g/dL (ref 30.0–36.0)
MCV: 86.6 fL (ref 80.0–100.0)
Monocytes Absolute: 0.5 10*3/uL (ref 0.1–1.0)
Monocytes Relative: 9 %
Neutro Abs: 2.9 10*3/uL (ref 1.7–7.7)
Neutrophils Relative %: 54 %
Platelet Count: 351 10*3/uL (ref 150–400)
RBC: 3.35 MIL/uL — ABNORMAL LOW (ref 3.87–5.11)
RDW: 14.2 % (ref 11.5–15.5)
WBC Count: 5.3 10*3/uL (ref 4.0–10.5)
nRBC: 0 % (ref 0.0–0.2)

## 2022-07-11 LAB — IRON AND IRON BINDING CAPACITY (CC-WL,HP ONLY)
Iron: 115 ug/dL (ref 28–170)
Saturation Ratios: 28 % (ref 10.4–31.8)
TIBC: 410 ug/dL (ref 250–450)
UIBC: 295 ug/dL (ref 148–442)

## 2022-07-14 ENCOUNTER — Inpatient Hospital Stay (HOSPITAL_BASED_OUTPATIENT_CLINIC_OR_DEPARTMENT_OTHER): Payer: BC Managed Care – PPO | Admitting: Adult Health

## 2022-07-14 ENCOUNTER — Encounter: Payer: Self-pay | Admitting: Adult Health

## 2022-07-14 VITALS — BP 122/64 | HR 80 | Temp 98.0°F | Resp 16 | Ht 67.0 in | Wt 221.5 lb

## 2022-07-14 DIAGNOSIS — D508 Other iron deficiency anemias: Secondary | ICD-10-CM

## 2022-07-14 DIAGNOSIS — D509 Iron deficiency anemia, unspecified: Secondary | ICD-10-CM | POA: Diagnosis not present

## 2022-07-14 NOTE — Progress Notes (Signed)
[facility] Cancer Follow up:    Virginia Au, MD [address] [place] [address]   DIAGNOSIS: Iron deficiency anemia  SUMMARY OF HEMATOLOGIC HISTORY: 12/19/2021: Hemoglobin 9.5, MCV 78.2, RDW 17.1, iron saturation 4%, TIBC 557, ferritin 3, B12 430 History of gastric bypass surgery  IV Iron with Venofer given at 300 mg weekly x 3 beginning 12/15  CURRENT THERAPY: Intermittent IV iron  INTERVAL HISTORY: Virginia Stewart 52 y.o. female returns for follow-up of her iron deficiency.  She underwent recent lab testing on July 11, 2022 which indicated her hemoglobin was 9.5, ferritin 51, saturation 28% TIBC 410, iron 115.  She notes that since she underwent her tummy tuck surgery at the end of March she has had some nausea accompanied with feeling increasingly tired.  She tells me that she is not drinking enough water but she is taking oral iron daily.  She is constipated having only 1 bowel movement per week and notes this began when she increased her oral iron tablets.   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  MEDICAL HISTORY: Past Medical History:  Diagnosis Date    Anemia    borderline   Anginal pain (HCC) 8/12   chest x ray, ekg epic- states was told anxiety attack- none since   Asthma    with seasonal allergies   Depression    Diabetes mellitus 2003   Headache(784.0)    Hyperlipidemia    Hypothyroidism    borderline   Insulin dependent type 2 diabetes mellitus (HCC)    Morbid obesity (HCC)     SURGICAL HISTORY: Past Surgical History:  Procedure Laterality Date   CESAREAN SECTION  04/16/2007   GASTRIC ROUX-EN-Y  09/08/2011   Procedure: LAPAROSCOPIC ROUX-EN-Y GASTRIC;  Surgeon: Atilano Ina, MD,FACS;  Location: [facility];  Service: General;  Laterality: N/A;    SOCIAL HISTORY: Social History   Socioeconomic History   Marital status: Married    Spouse name: Not on file   Number of children: Not on file   Years of education: Not on file   Highest education level: Not on file  Occupational History   Not on file  Tobacco Use   Smoking status: Former    Types: Cigarettes    Quit date: 06/11/2004    Years since quitting: 18.1   Smokeless tobacco: Never  Substance and Sexual Activity   Alcohol use: Yes    Alcohol/week: 2.0 - 3.0 standard drinks of alcohol    Types: 2 - 3 drink(s) per week    Comment: red wine   Drug use: No   Sexual activity: Not on file  Other Topics Concern   Not on file  Social  History Narrative   Not on file   Social Determinants of Health   Financial Resource Strain: Not on file  Food Insecurity: Not on file  Transportation Needs: Not on file  Physical Activity: Not on file  Stress: Not on file  Social Connections: Not on file  Intimate Partner Violence: Not on file    FAMILY HISTORY: Family History  Problem Relation Age of Onset   Diabetes Mother    Hypertension Mother    Diabetes Father    Lung cancer Father     Review of Systems  Constitutional:  Positive for fatigue. Negative for appetite change, chills, fever and unexpected weight change.  HENT:   Negative for hearing loss, lump/mass  and trouble swallowing.   Eyes:  Negative for eye problems and icterus.  Respiratory:  Negative for chest tightness, cough and shortness of breath.   Cardiovascular:  Negative for chest pain, leg swelling and palpitations.  Gastrointestinal:  Positive for constipation. Negative for abdominal distention, abdominal pain, diarrhea, nausea and vomiting.  Endocrine: Negative for hot flashes.  Genitourinary:  Negative for difficulty urinating.   Musculoskeletal:  Negative for arthralgias.  Skin:  Negative for itching and rash.  Neurological:  Negative for dizziness, extremity weakness, headaches and numbness.  Hematological:  Negative for adenopathy. Does not bruise/bleed easily.  Psychiatric/Behavioral:  Negative for depression. The patient is not nervous/anxious.       PHYSICAL EXAMINATION   Onc Performance Status - 07/14/22 0845       ECOG Perf Status   ECOG Perf Status Fully active, able to carry on all pre-disease performance without restriction      KPS SCALE   KPS % SCORE Normal, no compliants, no evidence of disease             Vitals:   07/14/22 0842  BP: 122/64  Pulse: 80  Resp: 16  Temp: 98 F (36.7 C)  SpO2: 100%   Patient appears well she is in no apparent distress, mood and behavior is normal breathing appears nonlabored, skin intact with no visible rash noted   LABORATORY DATA:  CBC    Component Value Date/Time   WBC 5.3 07/11/2022 1549   WBC 6.9 06/20/2022 1613   RBC 3.35 (L) 07/11/2022 1549   HGB 9.5 (L) 07/11/2022 1549   HCT 29.0 (L) 07/11/2022 1549   PLT 351 07/11/2022 1549   MCV 86.6 07/11/2022 1549   MCH 28.4 07/11/2022 1549   MCHC 32.8 07/11/2022 1549   RDW 14.2 07/11/2022 1549   LYMPHSABS 1.8 07/11/2022 1549   MONOABS 0.5 07/11/2022 1549   EOSABS 0.1 07/11/2022 1549   BASOSABS 0.0 07/11/2022 1549     ASSESSMENT and THERAPY PLAN:   Iron deficiency anemia due to dietary causes Iron deficiency anemia: Due to malabsorption from prior  gastric bypass surgery.  Patient is seeing gastroenterology   IV iron: December 2023  [facility] and I reviewed her labs in detail.  Her iron is improving and her hemoglobin at 9.5 is likely secondary to her recent tummy tuck surgery.  I anticipate that this number will continue to increase as her iron stores were repleted and are normal today.  She is not drinking enough water and she is having side effects from the oral iron she is taking.  I suggested that she increase her water intake, either stop or decrease her oral iron tablets.  She will return June 2 for repeat labs in June 4 to discuss these labs with Dr. Pamelia Hoit.  All questions were answered. The patient knows to call the clinic with any problems, questions or concerns. We can certainly see the patient much sooner if necessary.  Total encounter time:20 minutes*in face-to-face visit time, chart review, lab review, care coordination, order entry, and documentation of the encounter time.    Lillard Anes, NP 07/14/22 9:32 AM Medical Oncology and Hematology [facility] [address] [place], [place] [address] Tel. [phone]    Fax. [phone]  *Total Encounter Time as defined by the Centers for Medicare and Medicaid Services includes, in addition to the face-to-face time of a patient visit (documented in the note above) non-face-to-face time: obtaining and reviewing outside history, ordering and reviewing medications, tests or procedures, care coordination (communications with other health care professionals or caregivers) and documentation in the medical record.

## 2022-07-14 NOTE — Assessment & Plan Note (Signed)
Iron deficiency anemia: Due to malabsorption from prior gastric bypass surgery.  Patient is seeing gastroenterology   IV iron: December 2023  [facility] and I reviewed her labs in detail.  Her iron is improving and her hemoglobin at 9.5 is likely secondary to her recent tummy tuck surgery.  I anticipate that this number will continue to increase as her iron stores were repleted and are normal today.  She is not drinking enough water and she is having side effects from the oral iron she is taking.  I suggested that she increase her water intake, either stop or decrease her oral iron tablets.  She will return June 2 for repeat labs in June 4 to discuss these labs with Dr. Pamelia Hoit.

## 2022-08-04 ENCOUNTER — Encounter: Payer: Self-pay | Admitting: Hematology and Oncology

## 2022-08-05 ENCOUNTER — Encounter: Payer: Self-pay | Admitting: Hematology and Oncology

## 2022-08-26 ENCOUNTER — Inpatient Hospital Stay: Payer: BC Managed Care – PPO | Attending: Hematology and Oncology

## 2022-08-28 ENCOUNTER — Telehealth: Payer: Self-pay | Admitting: *Deleted

## 2022-08-28 ENCOUNTER — Inpatient Hospital Stay: Payer: BC Managed Care – PPO | Admitting: Hematology and Oncology

## 2022-08-28 NOTE — Assessment & Plan Note (Deleted)
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  Patient is seeing gastroenterology   IV iron: December 2023 Lab review: 05/22/2022: Hemoglobin 11.3, MCV 84.5, iron saturation 14%, ferritin 37 07/11/2022: Hemoglobin 9.5, MCV 86.6, ferritin 51, iron saturation 28% (this is following abdominal plastic surgery on 06/12/2022)

## 2022-08-28 NOTE — Telephone Encounter (Signed)
Per MD pt appt needing to be canceled due to pt being a no show for lab appt on 08/26/22.  RN attempt x1 to contact pt.  No answer.  LVM for pt to contact office to reschedule with labs and MD f/u 2 days later.

## 2022-12-31 ENCOUNTER — Encounter: Payer: Self-pay | Admitting: Hematology and Oncology

## 2023-01-08 ENCOUNTER — Encounter: Payer: Self-pay | Admitting: Podiatry

## 2023-01-08 ENCOUNTER — Ambulatory Visit: Payer: BC Managed Care – PPO

## 2023-01-08 ENCOUNTER — Ambulatory Visit: Payer: BC Managed Care – PPO | Admitting: Podiatry

## 2023-01-08 VITALS — Ht 67.0 in | Wt 220.0 lb

## 2023-01-08 DIAGNOSIS — M722 Plantar fascial fibromatosis: Secondary | ICD-10-CM | POA: Diagnosis not present

## 2023-01-08 DIAGNOSIS — M7750 Other enthesopathy of unspecified foot: Secondary | ICD-10-CM

## 2023-01-08 DIAGNOSIS — M7751 Other enthesopathy of right foot: Secondary | ICD-10-CM

## 2023-01-08 MED ORDER — TRIAMCINOLONE ACETONIDE 10 MG/ML IJ SUSP
10.0000 mg | Freq: Once | INTRAMUSCULAR | Status: AC
  Administered 2023-01-08: 10 mg via INTRA_ARTICULAR

## 2023-01-08 NOTE — Progress Notes (Signed)
Subjective:   Patient ID: Virginia Stewart, female   DOB: 52 y.o.   MRN: [id number]   HPI Patient presents with a lot of pain in the right forefoot with lesion formation and also has plantar fasciitis bilateral that is doing well but still symptomatic   ROS      Objective:  Physical Exam  Neurovascular status intact inflammation of the subfourth metatarsal with fluid buildup around the joint surface and keratotic lesion with heel pain which is better than it was moderately tender     Assessment:  Keratotic lesion formation with inflammatory capsulitis fourth MPJ and secondary plantar fasciitis     Plan:  H&P reviewed all conditions sterile prep injected the fourth MPJ 3 mg dexamethasone Kenalog 5 mg Xylocaine debrided lesion courtesy discussed Planter fasciitis reviewed with her exercises I want her to do shoe gear modifications not wearing flat shoes and ice therapy.  Reappoint as symptoms indicate  X-rays right indicate no arthritis or stress fracture

## 2023-03-23 ENCOUNTER — Encounter: Payer: Self-pay | Admitting: Hematology and Oncology

## 2024-03-01 ENCOUNTER — Encounter: Payer: Self-pay | Admitting: Hematology and Oncology

## 2024-03-03 ENCOUNTER — Encounter: Payer: Self-pay | Admitting: Hematology and Oncology

## 2024-03-04 ENCOUNTER — Ambulatory Visit: Payer: PRIVATE HEALTH INSURANCE

## 2024-03-04 ENCOUNTER — Ambulatory Visit: Payer: No Typology Code available for payment source | Admitting: Podiatry

## 2024-03-04 VITALS — Ht 67.0 in | Wt 220.0 lb

## 2024-03-04 DIAGNOSIS — M2042 Other hammer toe(s) (acquired), left foot: Secondary | ICD-10-CM | POA: Diagnosis not present

## 2024-03-04 DIAGNOSIS — M2041 Other hammer toe(s) (acquired), right foot: Secondary | ICD-10-CM | POA: Diagnosis not present

## 2024-03-04 DIAGNOSIS — M7750 Other enthesopathy of unspecified foot: Secondary | ICD-10-CM

## 2024-03-04 DIAGNOSIS — M7752 Other enthesopathy of left foot: Secondary | ICD-10-CM | POA: Diagnosis not present

## 2024-03-04 DIAGNOSIS — M7751 Other enthesopathy of right foot: Secondary | ICD-10-CM | POA: Diagnosis not present

## 2024-03-04 DIAGNOSIS — D492 Neoplasm of unspecified behavior of bone, soft tissue, and skin: Secondary | ICD-10-CM

## 2024-03-04 NOTE — Progress Notes (Signed)
 Subjective:   Patient ID: Virginia Stewart, female   DOB: 53 y.o.   MRN: [id number]   HPI Patient states her toes are very sore and the fifth and she knows that she needs to have something done and now she is developing more discomfort with a lesion on her left arch medial that she thinks has been there for several months and she is concerned about color of it   ROS      Objective:  Physical Exam  Neurovascular status intact with patient found to have rotation of the fifth digits both feet that are becoming very tender and has a soft tissue lesion on the left arch measuring about 4 x 4 mm painful when I pressed it.  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  Due to the nature of this I have recommended arthroplasty digit 5 both feet and I did explain that she has skin changes there is no guarantees we will to get this completely better but I am very hopeful this will make a huge difference for her.  I then went ahead and recommended a wide excision of the lesion left and I will send it to pathology and I allowed her to read consent form for correction going over all alternative treatments complications and after review she signed consent form.  Patient scheduled outpatient surgery and all questions are answered.  X-rays indicate rotation of the fifth digit bilateral with enlargement at head of proximal phalanx fifth digit bilateral

## 2024-03-07 ENCOUNTER — Telehealth: Payer: Self-pay | Admitting: Podiatry

## 2024-03-07 NOTE — Telephone Encounter (Signed)
 Called and confirmed surgery date of 12/23 with patient. Patient is on ozempic and has been given instructions on how to d/c use prior to surgery. Not on any blood thinners. Patient pharmacy correct in chart.

## 2024-03-08 ENCOUNTER — Telehealth: Payer: Self-pay | Admitting: Podiatry

## 2024-03-08 ENCOUNTER — Encounter: Payer: Self-pay | Admitting: Hematology and Oncology

## 2024-03-08 NOTE — Telephone Encounter (Signed)
 ERROR

## 2024-03-10 ENCOUNTER — Telehealth: Payer: Self-pay | Admitting: Podiatry

## 2024-03-10 NOTE — Telephone Encounter (Signed)
 DOS- 03/15/2024  5TH HAMMERTOE REPAIR BIL- 71714 EXC BENIGN LESION OVER 4.0 CM LT- 11426  [employer] EFFECTIVE DATE- 03/25/2023  DEDUCTIBLE- $1500 REMAINING- $1146.13 OOP- $5900 REMAINING- $2928.99 COINSURANCE- 30%  [employer] MEDICAID EFFECTIVE DATE- 09/22/2023  PER [employer] PORTAL, PRIOR AUTH IS NOT REQUIRED FOR CPT CODES 28285 (2 UNITS) AND 11426. PER [employer] PORTAL, PRIOR AUTH FOR CPT CODES 71714 (2 UNITS) AND 11426 HAVE BEEN APPROVED FROM 03/15/2024-06/13/2024. AUTH# [id number]

## 2024-03-14 MED ORDER — HYDROCODONE-ACETAMINOPHEN 10-325 MG PO TABS: 1.0000 | ORAL_TABLET | Freq: Three times a day (TID) | ORAL | 0 refills | Status: AC | PRN

## 2024-03-14 NOTE — Addendum Note (Signed)
 Addended by: MAGDALEN PASCO RAMAN on: 03/14/2024 04:58 PM   Modules accepted: Orders

## 2024-03-15 DIAGNOSIS — D492 Neoplasm of unspecified behavior of bone, soft tissue, and skin: Secondary | ICD-10-CM | POA: Diagnosis not present

## 2024-03-15 DIAGNOSIS — M2041 Other hammer toe(s) (acquired), right foot: Secondary | ICD-10-CM | POA: Diagnosis not present

## 2024-03-15 DIAGNOSIS — M2042 Other hammer toe(s) (acquired), left foot: Secondary | ICD-10-CM | POA: Diagnosis not present

## 2024-03-21 ENCOUNTER — Ambulatory Visit (INDEPENDENT_AMBULATORY_CARE_PROVIDER_SITE_OTHER)

## 2024-03-21 ENCOUNTER — Ambulatory Visit (INDEPENDENT_AMBULATORY_CARE_PROVIDER_SITE_OTHER): Payer: PRIVATE HEALTH INSURANCE | Admitting: Podiatry

## 2024-03-21 VITALS — BP 113/71 | HR 91 | Temp 97.3°F

## 2024-03-21 DIAGNOSIS — M2041 Other hammer toe(s) (acquired), right foot: Secondary | ICD-10-CM | POA: Diagnosis not present

## 2024-03-21 DIAGNOSIS — M2042 Other hammer toe(s) (acquired), left foot: Secondary | ICD-10-CM | POA: Diagnosis not present

## 2024-03-21 NOTE — Progress Notes (Signed)
 Patient presents for post-op visit today, POV #1 DOS 03/15/2024 B/L 5TH HAMMER TOE REPAIR/ LT EXC. BENIGN LESION OVER 4.0 CM  Doing well, they put me in a wheelchair after surgery. I walked up my driveway after surgery. No injuries to the feet. The right foot felt a little tight so I loosened the wrap. I sleep with a wedge under my feet and legs and I will roll over forgetting about surgery..  Notes: n/a  Vital Signs: Today's Vitals   03/21/24 0927  BP: 113/71  Pulse: 91  Temp: (!) 97.3 F (36.3 C)  TempSrc: Oral  PainSc: 3       Radiographs: [x]  Taken []  Not taken  Left Surgical Site Assessment:  - Dressing:  [x]  Minimal dry blood, intact []  Reinforced   []  Changed     -Notes: n/a  - Incision:  [x]  CDI (clean, dry, intact)  [x]  Mild erythema  []  Drainage noted   -Notes: n/a  - Swelling:  []  None  [x]  Mild  []  Moderate   []  Significant     -Notes: n/a  - Bruising:  [x]  None  []  Present: n/a   - Sutures/Staples:  []  None [x]  Intact  []  Removed Today  [x]  Plan to remove at next visit   -Cast/Splint/Pins: [x]  None []  Intact []  Removed Today []  Plan to remove at next visit []  Replaced  -Signs of infection:  [x]  None  []  Present - Describe: n/a  -DME:    []  None []  AFW [x]  Surgical shoe []  Cast  []  Splint   Right Surgical Site Assessment:  - Dressing:  [x]  Minimal dry blood, intact []  Reinforced   []  Changed     -Notes: n/a  - Incision:  [x]  CDI (clean, dry, intact)  [x]  Mild erythema  []  Drainage noted   -Notes: n/a  - Swelling:  []  None  [x]  Mild  []  Moderate   []  Significant     -Notes: n/a  - Bruising:  [x]  None  []  Present: n/a   - Sutures/Staples:  []  None [x]  Intact  []  Removed Today  [x]  Plan to remove at next visit   -Cast/Splint/Pins: [x]  None []  Intact []  Removed Today []  Plan to remove at next visit []  Replaced  -Signs of infection:  [x]  None  []  Present - Describe: n/a  -DME:    []  None []  AFW [x]  Surgical shoe []  Cast  []  Splint  -Walking  status:  [x]  Full WB  []  Partial WB  []  NWB  -Utilizing device:  [x]  None []  Knee Scooter []  Crutches []  Wheelchair    DVT assessment:  [x]  Denies symptoms []  Chest pain/SOB []  Pain in calf/redness/warmth   Redressed DSD and ace wrap. Educated on signs of infection, proper dressing care, pain management, and weight bearing status. Patient will contact provider with any new or worsening symptoms. The provider assessed the patient today and reviewed instructions regarding plan of care.

## 2024-03-21 NOTE — Progress Notes (Signed)
 Patient seen by nurse subjective:   Patient ID: Virginia Stewart, female   DOB: 53 y.o.   MRN: [id number]   HPI Patient seen by nurse doing well   ROS      Objective:  Physical Exam  Incisions look excellent alignment good     Assessment:  Doing well post arthroplasty     Plan:  Continue open toed shoes elevation compression reappoint 2 weeks suture removal earlier if needed seen by nurse  X-rays indicate satisfactory resection about

## 2024-03-22 ENCOUNTER — Other Ambulatory Visit: Payer: Self-pay | Admitting: Lab

## 2024-03-22 ENCOUNTER — Encounter: Payer: Self-pay | Admitting: Lab

## 2024-03-25 ENCOUNTER — Encounter: Payer: Self-pay | Admitting: Hematology and Oncology

## 2024-04-04 ENCOUNTER — Ambulatory Visit (INDEPENDENT_AMBULATORY_CARE_PROVIDER_SITE_OTHER)

## 2024-04-04 ENCOUNTER — Ambulatory Visit (INDEPENDENT_AMBULATORY_CARE_PROVIDER_SITE_OTHER): Payer: PRIVATE HEALTH INSURANCE | Admitting: Podiatry

## 2024-04-04 ENCOUNTER — Encounter: Payer: Self-pay | Admitting: Podiatry

## 2024-04-04 DIAGNOSIS — M2042 Other hammer toe(s) (acquired), left foot: Secondary | ICD-10-CM | POA: Diagnosis not present

## 2024-04-04 DIAGNOSIS — D492 Neoplasm of unspecified behavior of bone, soft tissue, and skin: Secondary | ICD-10-CM

## 2024-04-04 DIAGNOSIS — M2041 Other hammer toe(s) (acquired), right foot: Secondary | ICD-10-CM | POA: Diagnosis not present

## 2024-04-04 NOTE — Progress Notes (Signed)
 Subjective:   Patient ID: Virginia Stewart, female   DOB: 54 y.o.   MRN: [id number]   HPI Patient presents stating doing very well very pleased   ROS      Objective:  Physical Exam  Neurovascular status intact patient seen by nurse wound edges well coapted stitches in place     Assessment:  Doing well     Plan:  Stitches removed by nurse patient may begin gradually wearing soft shoe gear will be seen back as needed x-rays indicated satisfactory resection of bone bilateral

## 2024-04-04 NOTE — Progress Notes (Signed)
 Patient presents for post-op visit today, POV #2 DOS 03/15/2024 B/L 5TH HAMMER TOE REPAIR/ LT EXC. BENIGN LESION OVER 4.0 CM  Doing a lot better, still very sore. Walking more now. Doing good with them..  Notes: n/a  Vital Signs: Today's Vitals   04/04/24 1043  PainSc: 0-No pain      Radiographs: [x]  Taken []  Not taken  Left Surgical Site Assessment:  - Dressing:  []  Minimal dry blood, intact []  Reinforced   []  Changed     -Notes: no dressing  - Incision:  [x]  CDI (clean, dry, intact)  []  Mild erythema  []  Drainage noted   -Notes: n/a  - Swelling:  []  None  [x]  Mild  []  Moderate   []  Significant     -Notes: n/a  - Bruising:  [x]  None  []  Present: n/a   - Sutures/Staples:  []  None [x]  Intact  [x]  Removed Today  []  Plan to remove at next visit   -Cast/Splint/Pins: [x]  None []  Intact []  Removed Today []  Plan to remove at next visit []  Replaced  -Signs of infection:  [x]  None  []  Present - Describe: n/a   Surgical Site Assessment:  - Dressing:  []  Minimal dry blood, intact []  Reinforced   []  Changed     -Notes: no dressing  - Incision:  [x]  CDI (clean, dry, intact)  []  Mild erythema  []  Drainage noted   -Notes: n/a  - Swelling:  [x]  None  []  Mild  []  Moderate   []  Significant     -Notes: n/a  - Bruising:  [x]  None  []  Present: n/a   - Sutures/Staples:  []  None [x]  Intact  [x]  Removed Today  []  Plan to remove at next visit   -Cast/Splint/Pins: [x]  None []  Intact []  Removed Today []  Plan to remove at next visit []  Replaced  -Signs of infection:  [x]  None  []  Present - Describe: n/a  -DME:    []  None []  AFW [x]  Surgical shoe []  Cast  []  Splint  -Walking status:  [x]  Full WB  []  Partial WB  []  NWB  -Utilizing device:  [x]  None []  Knee Scooter []  Crutches []  Wheelchair    DVT assessment:  [x]  Denies symptoms []  Chest pain/SOB []  Pain in calf/redness/warmth   Redressed DSD and ace wrap. Educated on signs of infection, proper dressing care, pain management,  and weight bearing status. Patient will contact provider with any new or worsening symptoms. The provider assessed the patient today and reviewed instructions regarding plan of care.

## 2024-04-15 ENCOUNTER — Encounter: Payer: Self-pay | Admitting: Hematology and Oncology

## 2024-04-15 NOTE — Telephone Encounter (Signed)
 Left VM- 05/27/2022
